# Patient Record
Sex: Male | Born: 1975
Health system: Southern US, Community
[De-identification: ages and names within clinical notes are randomized; demographics above are authoritative.]

## PROBLEM LIST (undated history)

## (undated) DIAGNOSIS — Z8659 Personal history of other mental and behavioral disorders: Secondary | ICD-10-CM

## (undated) DIAGNOSIS — F329 Major depressive disorder, single episode, unspecified: Secondary | ICD-10-CM

## (undated) DIAGNOSIS — M1990 Unspecified osteoarthritis, unspecified site: Secondary | ICD-10-CM

## (undated) DIAGNOSIS — G8929 Other chronic pain: Secondary | ICD-10-CM

## (undated) DIAGNOSIS — Z8669 Personal history of other diseases of the nervous system and sense organs: Secondary | ICD-10-CM

## (undated) DIAGNOSIS — Z87828 Personal history of other (healed) physical injury and trauma: Secondary | ICD-10-CM

## (undated) DIAGNOSIS — K219 Gastro-esophageal reflux disease without esophagitis: Secondary | ICD-10-CM

## (undated) DIAGNOSIS — F32A Depression, unspecified: Secondary | ICD-10-CM

## (undated) DIAGNOSIS — B1920 Unspecified viral hepatitis C without hepatic coma: Secondary | ICD-10-CM

## (undated) DIAGNOSIS — R569 Unspecified convulsions: Secondary | ICD-10-CM

## (undated) DIAGNOSIS — I10 Essential (primary) hypertension: Secondary | ICD-10-CM

## (undated) DIAGNOSIS — F209 Schizophrenia, unspecified: Secondary | ICD-10-CM

## (undated) DIAGNOSIS — M549 Dorsalgia, unspecified: Secondary | ICD-10-CM

## (undated) HISTORY — DX: Unspecified viral hepatitis C without hepatic coma: B19.20

## (undated) HISTORY — PX: WISDOM TOOTH EXTRACTION: SHX21

## (undated) HISTORY — DX: Schizophrenia, unspecified: F20.9

## (undated) HISTORY — DX: Personal history of other mental and behavioral disorders: Z86.59

## (undated) HISTORY — PX: DECOMPRESSION FASCIOTOMY LEG: SUR403

## (undated) HISTORY — DX: Personal history of other (healed) physical injury and trauma: Z87.828

## (undated) HISTORY — DX: Personal history of other diseases of the nervous system and sense organs: Z86.69

## (undated) HISTORY — DX: Major depressive disorder, single episode, unspecified: F32.9

## (undated) HISTORY — DX: Depression, unspecified: F32.A

---

## 2001-01-22 ENCOUNTER — Emergency Department (HOSPITAL_COMMUNITY): Admission: EM | Admit: 2001-01-22 | Discharge: 2001-01-22 | Payer: Self-pay | Admitting: Internal Medicine

## 2001-01-22 ENCOUNTER — Encounter: Payer: Self-pay | Admitting: Internal Medicine

## 2002-02-22 ENCOUNTER — Encounter: Payer: Self-pay | Admitting: Emergency Medicine

## 2002-02-22 ENCOUNTER — Emergency Department (HOSPITAL_COMMUNITY): Admission: EM | Admit: 2002-02-22 | Discharge: 2002-02-22 | Payer: Self-pay | Admitting: Emergency Medicine

## 2005-07-08 ENCOUNTER — Emergency Department (HOSPITAL_COMMUNITY): Admission: EM | Admit: 2005-07-08 | Discharge: 2005-07-08 | Payer: Self-pay | Admitting: Emergency Medicine

## 2006-06-15 ENCOUNTER — Emergency Department (HOSPITAL_COMMUNITY): Admission: EM | Admit: 2006-06-15 | Discharge: 2006-06-15 | Payer: Self-pay | Admitting: Emergency Medicine

## 2007-01-18 ENCOUNTER — Emergency Department (HOSPITAL_COMMUNITY): Admission: EM | Admit: 2007-01-18 | Discharge: 2007-01-18 | Payer: Self-pay | Admitting: Emergency Medicine

## 2007-05-28 ENCOUNTER — Emergency Department (HOSPITAL_COMMUNITY): Admission: EM | Admit: 2007-05-28 | Discharge: 2007-05-28 | Payer: Self-pay | Admitting: Emergency Medicine

## 2007-06-01 ENCOUNTER — Emergency Department (HOSPITAL_COMMUNITY): Admission: EM | Admit: 2007-06-01 | Discharge: 2007-06-01 | Payer: Self-pay | Admitting: Emergency Medicine

## 2007-07-04 ENCOUNTER — Emergency Department (HOSPITAL_COMMUNITY): Admission: EM | Admit: 2007-07-04 | Discharge: 2007-07-04 | Payer: Self-pay | Admitting: Emergency Medicine

## 2007-08-02 ENCOUNTER — Emergency Department (HOSPITAL_COMMUNITY): Admission: EM | Admit: 2007-08-02 | Discharge: 2007-08-03 | Payer: Self-pay | Admitting: Emergency Medicine

## 2007-10-12 ENCOUNTER — Emergency Department (HOSPITAL_COMMUNITY): Admission: EM | Admit: 2007-10-12 | Discharge: 2007-10-13 | Payer: Self-pay | Admitting: Emergency Medicine

## 2007-10-14 ENCOUNTER — Emergency Department (HOSPITAL_COMMUNITY): Admission: EM | Admit: 2007-10-14 | Discharge: 2007-10-14 | Payer: Self-pay | Admitting: Emergency Medicine

## 2008-08-23 ENCOUNTER — Emergency Department (HOSPITAL_COMMUNITY): Admission: EM | Admit: 2008-08-23 | Discharge: 2008-08-23 | Payer: Self-pay | Admitting: Emergency Medicine

## 2008-10-19 ENCOUNTER — Ambulatory Visit (HOSPITAL_COMMUNITY): Admission: RE | Admit: 2008-10-19 | Discharge: 2008-10-19 | Payer: Self-pay | Admitting: Family Medicine

## 2009-02-13 ENCOUNTER — Ambulatory Visit (HOSPITAL_COMMUNITY): Admission: AD | Admit: 2009-02-13 | Discharge: 2009-02-13 | Payer: Self-pay | Admitting: Orthopedic Surgery

## 2009-04-11 ENCOUNTER — Encounter (INDEPENDENT_AMBULATORY_CARE_PROVIDER_SITE_OTHER): Payer: Self-pay | Admitting: Oral Surgery

## 2009-04-11 ENCOUNTER — Ambulatory Visit (HOSPITAL_COMMUNITY): Admission: RE | Admit: 2009-04-11 | Discharge: 2009-04-11 | Payer: Self-pay | Admitting: Oral Surgery

## 2009-07-04 ENCOUNTER — Emergency Department (HOSPITAL_COMMUNITY): Admission: EM | Admit: 2009-07-04 | Discharge: 2009-07-04 | Payer: Self-pay | Admitting: Emergency Medicine

## 2009-10-06 ENCOUNTER — Ambulatory Visit: Payer: Self-pay | Admitting: Family Medicine

## 2010-11-02 LAB — CBC
MCV: 81.1 fL (ref 78.0–100.0)
RBC: 4.77 MIL/uL (ref 4.22–5.81)
WBC: 6.9 10*3/uL (ref 4.0–10.5)

## 2010-11-04 LAB — CBC
HCT: 37.4 % — ABNORMAL LOW (ref 39.0–52.0)
Hemoglobin: 12.5 g/dL — ABNORMAL LOW (ref 13.0–17.0)
MCV: 80.1 fL (ref 78.0–100.0)
RBC: 4.67 MIL/uL (ref 4.22–5.81)
WBC: 8.7 10*3/uL (ref 4.0–10.5)

## 2010-11-04 LAB — COMPREHENSIVE METABOLIC PANEL
BUN: 14 mg/dL (ref 6–23)
CO2: 24 mEq/L (ref 19–32)
Chloride: 104 mEq/L (ref 96–112)
Creatinine, Ser: 0.94 mg/dL (ref 0.4–1.5)
GFR calc non Af Amer: >60 mL/min (ref >60)
Glucose, Bld: 108 mg/dL — ABNORMAL HIGH (ref 70–99)
Total Bilirubin: 0.4 mg/dL (ref 0.3–1.2)

## 2010-11-04 LAB — APTT: aPTT: 36 seconds (ref 24–37)

## 2010-11-04 LAB — DIFFERENTIAL
Basophils Absolute: 0 10*3/uL (ref 0.0–0.1)
Basophils Relative: 0 % (ref 0–1)
Eosinophils Relative: 1 % (ref 0–5)
Lymphocytes Relative: 18 % (ref 12–46)
Neutro Abs: 6.7 10*3/uL (ref 1.7–7.7)

## 2010-11-04 LAB — PROTIME-INR: Prothrombin Time: 13.8 seconds (ref 11.6–15.2)

## 2010-11-12 LAB — URINALYSIS, ROUTINE W REFLEX MICROSCOPIC
Bilirubin Urine: NEGATIVE
Glucose, UA: NEGATIVE mg/dL
Ketones, ur: NEGATIVE mg/dL
Protein, ur: NEGATIVE mg/dL
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
## 2011-02-20 ENCOUNTER — Encounter: Payer: Self-pay | Admitting: Emergency Medicine

## 2011-02-20 ENCOUNTER — Emergency Department (HOSPITAL_COMMUNITY)
Admission: EM | Admit: 2011-02-20 | Discharge: 2011-02-21 | Disposition: A | Discharge status: Home / Self Care | Payer: Medicare Other | Attending: Emergency Medicine | Admitting: Emergency Medicine

## 2011-02-20 ENCOUNTER — Emergency Department (HOSPITAL_COMMUNITY): Payer: Medicare Other

## 2011-02-20 DIAGNOSIS — Z87442 Personal history of urinary calculi: Secondary | ICD-10-CM | POA: Insufficient documentation

## 2011-02-20 DIAGNOSIS — R079 Chest pain, unspecified: Secondary | ICD-10-CM | POA: Insufficient documentation

## 2011-02-20 DIAGNOSIS — T148XXA Other injury of unspecified body region, initial encounter: Secondary | ICD-10-CM

## 2011-02-20 DIAGNOSIS — I10 Essential (primary) hypertension: Secondary | ICD-10-CM | POA: Insufficient documentation

## 2011-02-20 DIAGNOSIS — IMO0001 Reserved for inherently not codable concepts without codable children: Secondary | ICD-10-CM | POA: Insufficient documentation

## 2011-02-20 DIAGNOSIS — IMO0002 Reserved for concepts with insufficient information to code with codable children: Secondary | ICD-10-CM | POA: Insufficient documentation

## 2011-02-20 DIAGNOSIS — R4182 Altered mental status, unspecified: Secondary | ICD-10-CM | POA: Insufficient documentation

## 2011-02-20 DIAGNOSIS — F17200 Nicotine dependence, unspecified, uncomplicated: Secondary | ICD-10-CM | POA: Insufficient documentation

## 2011-02-20 DIAGNOSIS — M791 Myalgia, unspecified site: Secondary | ICD-10-CM

## 2011-02-20 DIAGNOSIS — R52 Pain, unspecified: Secondary | ICD-10-CM | POA: Insufficient documentation

## 2011-02-20 HISTORY — DX: Essential (primary) hypertension: I10

## 2011-02-20 LAB — CBC
Hemoglobin: 12.5 g/dL — ABNORMAL LOW (ref 13.0–17.0)
MCH: 27.1 pg (ref 26.0–34.0)
Platelets: 245 10*3/uL (ref 150–400)
RBC: 4.61 MIL/uL (ref 4.22–5.81)
WBC: 9.7 10*3/uL (ref 4.0–10.5)

## 2011-02-20 LAB — BASIC METABOLIC PANEL
Calcium: 9 mg/dL (ref 8.4–10.5)
GFR calc Af Amer: >60 mL/min (ref >60)
GFR calc non Af Amer: >60 mL/min (ref >60)
Glucose, Bld: 90 mg/dL (ref 70–99)
Potassium: 3.8 mEq/L (ref 3.5–5.1)
Sodium: 138 mEq/L (ref 135–145)

## 2011-02-20 LAB — SAMPLE TO BLOOD BANK

## 2011-02-20 MED ORDER — MORPHINE SULFATE 4 MG/ML IJ SOLN
4.0000 mg | Freq: Once | INTRAMUSCULAR | Status: AC
  Administered 2011-02-20: 4 mg via INTRAVENOUS
  Filled 2011-02-20: qty 1

## 2011-02-20 MED ORDER — ONDANSETRON HCL 4 MG/2ML IJ SOLN
4.0000 mg | Freq: Once | INTRAMUSCULAR | Status: AC
  Administered 2011-02-20: 4 mg via INTRAVENOUS
  Filled 2011-02-20: qty 2
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
## 2011-02-20 NOTE — ED Notes (Signed)
J. Idol, PA at bedside. 

## 2011-02-20 NOTE — ED Notes (Signed)
Patient was informed that an UA is needed and patient states that his back hurts way too much to be sitting up. When attempting to raise the head of the stretcher patient was stating he was in too much pain to lay the head back down. Family member in room wants to know why urine is needed stated to patient that urine is part of protocol.

## 2011-02-20 NOTE — ED Notes (Signed)
Patient is complaining of pain. Family at bedside. Hooked patient up to the monitor and took vitals. RN Janette aware of pain she is in now giving patient an IV.

## 2011-02-20 NOTE — ED Notes (Signed)
MD Fredricka Bonine cleared patient to come off the back born.

## 2011-02-21 ENCOUNTER — Encounter (HOSPITAL_COMMUNITY): Payer: Self-pay | Admitting: *Deleted

## 2011-02-21 ENCOUNTER — Emergency Department (HOSPITAL_COMMUNITY): Payer: Medicare Other

## 2011-02-21 LAB — RAPID URINE DRUG SCREEN, HOSP PERFORMED
Amphetamines: NOT DETECTED
Barbiturates: NOT DETECTED

## 2011-02-21 MED ORDER — CYCLOBENZAPRINE HCL 5 MG PO TABS: 5.0000 mg | ORAL_TABLET | Freq: Three times a day (TID) | ORAL | Status: AC | PRN

## 2011-02-21 MED ORDER — IOHEXOL 300 MG/ML  SOLN
120.0000 mL | Freq: Once | INTRAMUSCULAR | Status: AC | PRN
  Administered 2011-02-21: 120 mL via INTRAVENOUS

## 2011-02-21 MED ORDER — OXYCODONE-ACETAMINOPHEN 5-325 MG PO TABS
2.0000 | ORAL_TABLET | Freq: Once | ORAL | Status: AC
  Administered 2011-02-21: 2 via ORAL
  Filled 2011-02-21: qty 2
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
## 2011-05-08 LAB — CULTURE, ROUTINE-ABSCESS

## 2011-11-27 ENCOUNTER — Ambulatory Visit: Payer: Self-pay | Admitting: Unknown Physician Specialty

## 2012-01-15 ENCOUNTER — Emergency Department (HOSPITAL_COMMUNITY)
Admission: EM | Admit: 2012-01-15 | Discharge: 2012-01-15 | Disposition: A | Discharge status: Home / Self Care | Payer: Medicare Other | Attending: Emergency Medicine | Admitting: Emergency Medicine

## 2012-01-15 ENCOUNTER — Encounter (HOSPITAL_COMMUNITY): Payer: Self-pay | Admitting: Emergency Medicine

## 2012-01-15 DIAGNOSIS — M129 Arthropathy, unspecified: Secondary | ICD-10-CM | POA: Insufficient documentation

## 2012-01-15 DIAGNOSIS — Z885 Allergy status to narcotic agent status: Secondary | ICD-10-CM | POA: Insufficient documentation

## 2012-01-15 DIAGNOSIS — Z886 Allergy status to analgesic agent status: Secondary | ICD-10-CM | POA: Insufficient documentation

## 2012-01-15 DIAGNOSIS — L509 Urticaria, unspecified: Secondary | ICD-10-CM | POA: Insufficient documentation

## 2012-01-15 DIAGNOSIS — F17200 Nicotine dependence, unspecified, uncomplicated: Secondary | ICD-10-CM | POA: Insufficient documentation

## 2012-01-15 DIAGNOSIS — Z87442 Personal history of urinary calculi: Secondary | ICD-10-CM | POA: Insufficient documentation

## 2012-01-15 DIAGNOSIS — Z880 Allergy status to penicillin: Secondary | ICD-10-CM | POA: Insufficient documentation

## 2012-01-15 DIAGNOSIS — I10 Essential (primary) hypertension: Secondary | ICD-10-CM | POA: Insufficient documentation

## 2012-01-15 DIAGNOSIS — Z79899 Other long term (current) drug therapy: Secondary | ICD-10-CM | POA: Insufficient documentation

## 2012-01-15 HISTORY — DX: Unspecified osteoarthritis, unspecified site: M19.90

## 2012-01-15 HISTORY — DX: Dorsalgia, unspecified: M54.9

## 2012-01-15 HISTORY — DX: Other chronic pain: G89.29

## 2012-01-15 MED ORDER — PREDNISONE 50 MG PO TABS: 50.0000 mg | ORAL_TABLET | Freq: Every day | ORAL | Status: DC

## 2012-01-15 MED ORDER — FAMOTIDINE 20 MG PO TABS: 20.0000 mg | ORAL_TABLET | Freq: Two times a day (BID) | ORAL | Status: DC

## 2012-01-15 MED ORDER — METHYLPREDNISOLONE SODIUM SUCC 125 MG IJ SOLR
125.0000 mg | Freq: Once | INTRAMUSCULAR | Status: AC
  Administered 2012-01-15: 125 mg via INTRAVENOUS
  Filled 2012-01-15: qty 2

## 2012-01-15 MED ORDER — DIPHENHYDRAMINE HCL 50 MG/ML IJ SOLN
25.0000 mg | Freq: Once | INTRAMUSCULAR | Status: AC
  Administered 2012-01-15: 25 mg via INTRAVENOUS
  Filled 2012-01-15: qty 1

## 2012-01-15 MED ORDER — DIPHENHYDRAMINE HCL 25 MG PO TABS: 25.0000 mg | ORAL_TABLET | ORAL | Status: DC | PRN

## 2012-01-15 MED ORDER — LORATADINE 10 MG PO TABS: 10.0000 mg | ORAL_TABLET | Freq: Every day | ORAL | Status: DC

## 2012-01-15 MED ORDER — FAMOTIDINE IN NACL 20-0.9 MG/50ML-% IV SOLN
20.0000 mg | Freq: Once | INTRAVENOUS | Status: AC
  Administered 2012-01-15: 20 mg via INTRAVENOUS
  Filled 2012-01-15: qty 50

## 2012-01-15 NOTE — ED Provider Notes (Signed)
History     CSN: [id number]  Arrival date & time 01/15/12  4098   First MD Initiated Contact with Patient 01/15/12 0415      Chief Complaint  Patient presents with  . Allergic Reaction    (Consider location/radiation/quality/duration/timing/severity/associated sxs/prior treatment) Patient is a 36 y.o. male presenting with allergic reaction. The history is provided by the patient.  Allergic Reaction  He had onset at about 3 AM of rash on his chest, back, abdomen, arms. Rashes) tic. He denies any difficulty breathing or swallowing. He used a skin protectant cream which did not give him any relief. Any other medication at home. He denies any new medications and denies any new soaps, laundry detergents, fabric softeners, colognes, foods. He has never had reactions like this before. Symptoms are severe. Nothing makes it better nothing makes it worse. Symptoms have been stable since onset.  Past Medical History  Diagnosis Date  . Hypertension   . Kidney calculi   . Kidney calculi   . Chronic back pain   . Arthritis     History reviewed. No pertinent past surgical history.  History reviewed. No pertinent family history.  History  Substance Use Topics  . Smoking status: Current Everyday Smoker -- 1.0 packs/day    Types: Cigarettes  . Smokeless tobacco: Not on file  . Alcohol Use: Yes     occasionally      Review of Systems  All other systems reviewed and are negative.    Allergies  Amoxicillin; Hydrocodone; and Ibuprofen  Home Medications   Current Outpatient Rx  Name Route Sig Dispense Refill  . CLONIDINE HCL PO Oral Take 1 tablet by mouth daily.      . DULOXETINE HCL 30 MG PO CPEP Oral Take 90 mg by mouth daily.      Marland Kitchen NEURONTIN PO Oral Take by mouth.      Marland Kitchen LISINOPRIL PO Oral Take by mouth.      . OXYCODONE HCL ER 10 MG PO TB12 Oral Take by mouth.        BP 141/79  Pulse 77  Temp 97.7 F (36.5 C) (Oral)  Resp 16  Ht 6' (1.829 m)  Wt 241 lb (109.317 kg)   BMI 32.69 kg/m2  SpO2 96%  Physical Exam  Nursing note and vitals reviewed.  36 year old male who is resting comfortably and in no acute distress. Vital signs are significant for borderline hypertension with blood pressure 141/79. Oxygen saturation is 96% on room air which is normal head is normocephalic and atraumatic. PERRLA, EOMI. Oropharynx is clear. There is no edema the uvula oropharynx or tongue or lips. He has had difficulty with secretions and phonation is normal. Neck is nontender and supple without adenopathy or stridor. Lungs are clear without rales, wheezes, rhonchi. Heart has regular rate and rhythm without murmur. Abdomen is soft, flat, nontender without masses or hepatosplenomegaly. Extremities have no cyanosis or edema, full range of motion is present. Skin: There is an urticarial rash with large areas of confluence over the chest, back, abdomen and arms. Neurologic: Mental status is normal, cranial nerves are intact, there are no motor or sensory deficits.  ED Course  Procedures (including critical care time)    1. Urticaria       MDM  Urticaria-etiology unclear. You will be treated with steroids, H2 blockers, and antihistamines.  After IV Solu-Medrol, IV diphenhydramine, and IV famotidine, rash went away completely and itching went away completely. He is sent home with a prescription  for prednisone, loratadine, famotidine, and diphenhydramine.      Dione Booze, MD 01/15/12 [phone]

## 2012-01-15 NOTE — ED Notes (Signed)
Patient complaining of hives on torso, back, and bilateral arms started about 1 hour ago.

## 2012-01-15 NOTE — ED Notes (Signed)
edp aware of severity of allergic reaction.

## 2012-01-15 NOTE — Discharge Instructions (Signed)
Return to the emergency department if you have further problems-especially any problems breathing or swallowing.  Hives Hives (urticaria) are itchy, red, swollen patches on the skin. They may change size, shape, and location quickly and repeatedly. Hives that occur deeper in the skin can cause swelling of the hands, feet, and face. Hives may be an allergic reaction to something you or your child ate, touched, or put on the skin. Hives can also be a reaction to cold, heat, viral infections, medication, insect bites, or emotional stress. Often the cause is hard to find. Hives can come and go for several days to several weeks. Hives are not contagious. HOME CARE INSTRUCTIONS   If the cause of the hives is known, avoid exposure to that source.   To relieve itching and rash:   Apply cold compresses to the skin or take cool water baths. Do not take or give your child hot baths or showers because the warmth will make the itching worse.   The best medicine for hives is an antihistamine. An antihistamine will not cure hives, but it will reduce their severity. You can use an antihistamine available over the counter. This medicine may make your child sleepy. Teenagers should not drive while using this medicine.   Take or give an antihistamine every 6 hours until the hives are completely gone for 24 hours or as directed.   Your child may have other medications prescribed for itching. Give these as directed by your child's caregiver.   You or your child should wear loose fitting clothing, including undergarments. Skin irritations may make hives worse.   Follow-up as directed by your caregiver.  SEEK MEDICAL CARE IF:   You or your child still have considerable itching after taking the medication (prescribed or purchased over the counter).   Joint swelling or pain occurs.  SEEK IMMEDIATE MEDICAL CARE IF:   You have a fever.   Swollen lips or tongue are noticed.   There is difficulty with breathing,  swallowing, or tightness in the throat or chest.   Abdominal pain develops.   Your child starts acting very sick.  These may be the first signs of a life-threatening allergic reaction. THIS IS AN EMERGENCY. Call 911 for medical help. MAKE SURE YOU:   Understand these instructions.   Will watch your condition.   Will get help right away if you are not doing well or get worse.  Document Released: 07/15/2005 Document Revised: 07/04/2011 Document Reviewed: 03/04/2008 [facility] Patient Information 2012 [place], [place].  Prednisone tablets What is this medicine? PREDNISONE (PRED ni sone) is a corticosteroid. It is commonly used to treat inflammation of the skin, joints, lungs, and other organs. Common conditions treated include asthma, allergies, and arthritis. It is also used for other conditions, such as blood disorders and diseases of the adrenal glands. This medicine may be used for other purposes; ask your health care provider or pharmacist if you have questions. What should I tell my health care provider before I take this medicine? They need to know if you have any of these conditions: -Cushing's syndrome -diabetes -glaucoma -heart disease -high blood pressure -infection (especially a virus infection such as chickenpox, cold sores, or herpes) -kidney disease -liver disease -mental illness -myasthenia gravis -osteoporosis -seizures -stomach or intestine problems -thyroid disease -an unusual or allergic reaction to lactose, prednisone, other medicines, foods, dyes, or preservatives -pregnant or trying to get pregnant -breast-feeding How should I use this medicine? Take this medicine by mouth with a glass of  water. Follow the directions on the prescription label. Take this medicine with food. If you are taking this medicine once a day, take it in the morning. Do not take more medicine than you are told to take. Do not suddenly stop taking your medicine because you may develop a  severe reaction. Your doctor will tell you how much medicine to take. If your doctor wants you to stop the medicine, the dose may be slowly lowered over time to avoid any side effects. Talk to your pediatrician regarding the use of this medicine in children. Special care may be needed. Overdosage: If you think you have taken too much of this medicine contact a poison control center or emergency room at once. NOTE: This medicine is only for you. Do not share this medicine with others. What if I miss a dose? If you miss a dose, take it as soon as you can. If it is almost time for your next dose, talk to your doctor or health care professional. You may need to miss a dose or take an extra dose. Do not take double or extra doses without advice. What may interact with this medicine? Do not take this medicine with any of the following medications: -metyrapone -mifepristone This medicine may also interact with the following medications: -aminoglutethimide -amphotericin B -aspirin and aspirin-like medicines -barbiturates -certain medicines for diabetes, like glipizide or glyburide -cholestyramine -cholinesterase inhibitors -cyclosporine -digoxin -diuretics -ephedrine -male hormones, like estrogens and birth control pills -isoniazid -ketoconazole -NSAIDS, medicines for pain and inflammation, like ibuprofen or naproxen -phenytoin -rifampin -toxoids -vaccines -warfarin This list may not describe all possible interactions. Give your health care provider a list of all the medicines, herbs, non-prescription drugs, or dietary supplements you use. Also tell them if you smoke, drink alcohol, or use illegal drugs. Some items may interact with your medicine. What should I watch for while using this medicine? Visit your doctor or health care professional for regular checks on your progress. If you are taking this medicine over a prolonged period, carry an identification card with your name and  address, the type and dose of your medicine, and your doctor's name and address. This medicine may increase your risk of getting an infection. Tell your doctor or health care professional if you are around anyone with measles or chickenpox, or if you develop sores or blisters that do not heal properly. If you are going to have surgery, tell your doctor or health care professional that you have taken this medicine within the last twelve months. Ask your doctor or health care professional about your diet. You may need to lower the amount of salt you eat. This medicine may affect blood sugar levels. If you have diabetes, check with your doctor or health care professional before you change your diet or the dose of your diabetic medicine. What side effects may I notice from receiving this medicine? Side effects that you should report to your doctor or health care professional as soon as possible: -allergic reactions like skin rash, itching or hives, swelling of the face, lips, or tongue -changes in emotions or moods -changes in vision -depressed mood -eye pain -fever or chills, cough, sore throat, pain or difficulty passing urine -increased thirst -swelling of ankles, feet Side effects that usually do not require medical attention (report to your doctor or health care professional if they continue or are bothersome): -confusion, excitement, restlessness -headache -nausea, vomiting -skin problems, acne, thin and shiny skin -trouble sleeping -weight gain This list may  not describe all possible side effects. Call your doctor for medical advice about side effects. You may report side effects to FDA at 1-800-FDA-1088. Where should I keep my medicine? Keep out of the reach of children. Store at room temperature between 15 and 30 degrees C (59 and 86 degrees F). Protect from light. Keep container tightly closed. Throw away any unused medicine after the expiration date. NOTE: This sheet is a summary. It  may not cover all possible information. If you have questions about this medicine, talk to your doctor, pharmacist, or health care provider.  2012, [employer]. (02/28/2011 10:57:14 AM)  Famotidine tablets or gelcaps What is this medicine? FAMOTIDINE (fa MOE ti deen) is a type of antihistamine that blocks the release of stomach acid. It is used to treat stomach or intestinal ulcers. It can also relieve heartburn from acid reflux. This medicine may be used for other purposes; ask your health care provider or pharmacist if you have questions. What should I tell my health care provider before I take this medicine? They need to know if you have any of these conditions: -kidney or liver disease -trouble swallowing -an unusual or allergic reaction to famotidine, other medicines, foods, dyes, or preservatives -pregnant or trying to get pregnant -breast-feeding How should I use this medicine? Take this medicine by mouth with a glass of water. Follow the directions on the prescription label. If you only take this medicine once a day, take it at bedtime. Take your doses at regular intervals. Do not take your medicine more often than directed. Talk to your pediatrician regarding the use of this medicine in children. Special care may be needed. Overdosage: If you think you have taken too much of this medicine contact a poison control center or emergency room at once. NOTE: This medicine is only for you. Do not share this medicine with others. What if I miss a dose? If you miss a dose, take it as soon as you can. If it is almost time for your next dose, take only that dose. Do not take double or extra doses. What may interact with this medicine? -delavirdine -itraconazole -ketoconazole This list may not describe all possible interactions. Give your health care provider a list of all the medicines, herbs, non-prescription drugs, or dietary supplements you use. Also tell them if you smoke, drink  alcohol, or use illegal drugs. Some items may interact with your medicine. What should I watch for while using this medicine? Tell your doctor or health care professional if your condition does not start to get better or if it gets worse. Finish the full course of tablets prescribed, even if you feel better. Do not take with aspirin, ibuprofen or other antiinflammatory medicines. These can make your condition worse. Do not smoke cigarettes or drink alcohol. These cause irritation in your stomach and can increase the time it will take for ulcers to heal. If you need to take an antacid, you should take it at least 1 hour before or 1 hour after this medicine. This medicine will not be as effective if taken at the same time as an antacid. If you get black, tarry stools or vomit up what looks like coffee grounds, call your doctor or health care professional at once. You may have a bleeding ulcer. What side effects may I notice from receiving this medicine? Side effects that you should report to your doctor or health care professional as soon as possible: -agitation, nervousness -confusion -hallucinations -skin rash, itching Side  effects that usually do not require medical attention (report to your doctor or health care professional if they continue or are bothersome): -constipation -diarrhea -dizziness -headache This list may not describe all possible side effects. Call your doctor for medical advice about side effects. You may report side effects to FDA at 1-800-FDA-1088. Where should I keep my medicine? Keep out of the reach of children. Store at room temperature between 15 and 30 degrees C (59 and 86 degrees F). Do not freeze. Throw away any unused medicine after the expiration date. NOTE: This sheet is a summary. It may not cover all possible information. If you have questions about this medicine, talk to your doctor, pharmacist, or health care provider.  2012, [employer].  (11/18/2007 1:21:06 PM)  Loratadine tablets What is this medicine? LORATADINE (lor AT a deen) is an antihistamine. It helps to relieve sneezing, runny nose, and itchy, watery eyes. This medicine is used to treat the symptoms of allergies. It is also used to treat itchy skin rash and hives. This medicine may be used for other purposes; ask your health care provider or pharmacist if you have questions. What should I tell my health care provider before I take this medicine? They need to know if you have any of these conditions: -asthma -kidney disease -liver disease -an unusual or allergic reaction to loratadine, other antihistamines, other medicines, foods, dyes, or preservatives -pregnant or trying to get pregnant -breast-feeding How should I use this medicine? Take this medicine by mouth with a glass of water. Follow the directions on the label. You may take this medicine with food or on an empty stomach. Take your medicine at regular intervals. Do not take your medicine more often than directed. Talk to your pediatrician regarding the use of this medicine in children. While this medicine may be used in children as young as 6 years for selected conditions, precautions do apply. Overdosage: If you think you have taken too much of this medicine contact a poison control center or emergency room at once. NOTE: This medicine is only for you. Do not share this medicine with others. What if I miss a dose? If you miss a dose, take it as soon as you can. If it is almost time for your next dose, take only that dose. Do not take double or extra doses. What may interact with this medicine? -other medicines for colds or allergies This list may not describe all possible interactions. Give your health care provider a list of all the medicines, herbs, non-prescription drugs, or dietary supplements you use. Also tell them if you smoke, drink alcohol, or use illegal drugs. Some items may interact with your  medicine. What should I watch for while using this medicine? Tell your doctor or healthcare professional if your symptoms do not start to get better or if they get worse. Your mouth may get dry. Chewing sugarless gum or sucking hard candy, and drinking plenty of water may help. Contact your doctor if the problem does not go away or is severe. You may get drowsy or dizzy. Do not drive, use machinery, or do anything that needs mental alertness until you know how this medicine affects you. Do not stand or sit up quickly, especially if you are an older patient. This reduces the risk of dizzy or fainting spells. What side effects may I notice from receiving this medicine? Side effects that you should report to your doctor or health care professional as soon as possible: -allergic reactions like  skin rash, itching or hives, swelling of the face, lips, or tongue -breathing problems -unusually restless or nervous Side effects that usually do not require medical attention (report to your doctor or health care professional if they continue or are bothersome): -drowsiness -dry or irritated mouth or throat -headache This list may not describe all possible side effects. Call your doctor for medical advice about side effects. You may report side effects to FDA at 1-800-FDA-1088. Where should I keep my medicine? Keep out of the reach of children. Store at room temperature between 2 and 30 degrees C (36 and 86 degrees F). Protect from moisture. Throw away any unused medicine after the expiration date. NOTE: This sheet is a summary. It may not cover all possible information. If you have questions about this medicine, talk to your doctor, pharmacist, or health care provider.  2012, [employer]. (01/18/2008 5:17:24 PM)  Diphenhydramine capsules or tablets What is this medicine? DIPHENHYDRAMINE (dye fen HYE dra meen) is an antihistamine. It is used to treat the symptoms of an allergic reaction. It is  also used to treat Parkinson's disease. This medicine is also used to prevent and to treat motion sickness and as a nighttime sleep aid. This medicine may be used for other purposes; ask your health care provider or pharmacist if you have questions. What should I tell my health care provider before I take this medicine? They need to know if you have any of these conditions: -asthma or lung disease -glaucoma -high blood pressure or heart disease -liver disease -pain or difficulty passing urine -prostate trouble -ulcers or other stomach problems -an unusual or allergic reaction to diphenhydramine, other medicines foods, dyes, or preservatives such as sulfites -pregnant or trying to get pregnant -breast-feeding How should I use this medicine? Take this medicine by mouth with a full glass of water. Follow the directions on the prescription label. Take your doses at regular intervals. Do not take your medicine more often than directed. To prevent motion sickness start taking this medicine 30 to 60 minutes before you leave. Talk to your pediatrician regarding the use of this medicine in children. Special care may be needed. Patients over 6 years old may have a stronger reaction and need a smaller dose. Overdosage: If you think you have taken too much of this medicine contact a poison control center or emergency room at once. NOTE: This medicine is only for you. Do not share this medicine with others. What if I miss a dose? If you miss a dose, take it as soon as you can. If it is almost time for your next dose, take only that dose. Do not take double or extra doses. What may interact with this medicine? Do not take this medicine with any of the following medications: -MAOIs like Carbex, Eldepryl, Marplan, Nardil, and Parnate This medicine may also interact with the following medications: -alcohol -barbiturates, like phenobarbital -medicines for bladder spasm like oxybutynin,  tolterodine -medicines for blood pressure -medicines for depression, anxiety, or psychotic disturbances -medicines for movement abnormalities or Parkinson's disease -medicines for sleep -other medicines for cold, cough or allergy -some medicines for the stomach like chlordiazepoxide, dicyclomine This list may not describe all possible interactions. Give your health care provider a list of all the medicines, herbs, non-prescription drugs, or dietary supplements you use. Also tell them if you smoke, drink alcohol, or use illegal drugs. Some items may interact with your medicine. What should I watch for while using this medicine? Visit your doctor or  health care professional for regular check ups. Tell your doctor if your symptoms do not improve or if they get worse. Your mouth may get dry. Chewing sugarless gum or sucking hard candy, and drinking plenty of water may help. Contact your doctor if the problem does not go away or is severe. This medicine may cause dry eyes and blurred vision. If you wear contact lenses you may feel some discomfort. Lubricating drops may help. See your eye doctor if the problem does not go away or is severe. You may get drowsy or dizzy. Do not drive, use machinery, or do anything that needs mental alertness until you know how this medicine affects you. Do not stand or sit up quickly, especially if you are an older patient. This reduces the risk of dizzy or fainting spells. Alcohol may interfere with the effect of this medicine. Avoid alcoholic drinks. What side effects may I notice from receiving this medicine? Side effects that you should report to your doctor or health care professional as soon as possible: -allergic reactions like skin rash, itching or hives, swelling of the face, lips, or tongue -changes in vision -confused, agitated, nervous -irregular or fast heartbeat -tremor -trouble passing urine -unusual bleeding or bruising -unusually weak or tired Side  effects that usually do not require medical attention (report to your doctor or health care professional if they continue or are bothersome): -constipation, diarrhea -drowsy -headache -loss of appetite -stomach upset, vomiting -thick mucous This list may not describe all possible side effects. Call your doctor for medical advice about side effects. You may report side effects to FDA at 1-800-FDA-1088. Where should I keep my medicine? Keep out of the reach of children. Store at room temperature between 15 and 30 degrees C (59 and 86 degrees F). Keep container closed tightly. Throw away any unused medicine after the expiration date. NOTE: This sheet is a summary. It may not cover all possible information. If you have questions about this medicine, talk to your doctor, pharmacist, or health care provider.  2012, [employer]. (11/02/2007 5:06:22 PM)

## 2012-02-17 ENCOUNTER — Other Ambulatory Visit: Payer: Self-pay | Admitting: Neurology

## 2012-02-17 DIAGNOSIS — R413 Other amnesia: Secondary | ICD-10-CM

## 2012-02-17 DIAGNOSIS — G40209 Localization-related (focal) (partial) symptomatic epilepsy and epileptic syndromes with complex partial seizures, not intractable, without status epilepticus: Secondary | ICD-10-CM

## 2012-02-21 ENCOUNTER — Ambulatory Visit
Admission: RE | Admit: 2012-02-21 | Discharge: 2012-02-21 | Disposition: A | Discharge status: Home / Self Care | Payer: Medicare Other | Source: Ambulatory Visit | Attending: Neurology | Admitting: Neurology

## 2012-02-21 ENCOUNTER — Encounter: Payer: Self-pay | Admitting: Cardiovascular Disease

## 2012-02-21 DIAGNOSIS — R413 Other amnesia: Secondary | ICD-10-CM

## 2012-02-21 DIAGNOSIS — G40209 Localization-related (focal) (partial) symptomatic epilepsy and epileptic syndromes with complex partial seizures, not intractable, without status epilepticus: Secondary | ICD-10-CM

## 2012-02-21 MED ORDER — GADOBENATE DIMEGLUMINE 529 MG/ML IV SOLN
20.0000 mL | Freq: Once | INTRAVENOUS | Status: AC | PRN
  Administered 2012-02-21: 20 mL via INTRAVENOUS

## 2012-02-26 ENCOUNTER — Ambulatory Visit: Payer: Medicare Other | Admitting: Cardiovascular Disease

## 2012-03-05 ENCOUNTER — Ambulatory Visit: Payer: Medicare Other | Admitting: Cardiovascular Disease

## 2012-03-07 ENCOUNTER — Encounter (HOSPITAL_COMMUNITY): Payer: Self-pay | Admitting: Emergency Medicine

## 2012-03-07 ENCOUNTER — Emergency Department (HOSPITAL_COMMUNITY): Payer: Medicare Other

## 2012-03-07 ENCOUNTER — Emergency Department (HOSPITAL_COMMUNITY)
Admission: EM | Admit: 2012-03-07 | Discharge: 2012-03-07 | Disposition: A | Discharge status: Home / Self Care | Payer: Medicare Other | Attending: Emergency Medicine | Admitting: Emergency Medicine

## 2012-03-07 DIAGNOSIS — R404 Transient alteration of awareness: Secondary | ICD-10-CM | POA: Insufficient documentation

## 2012-03-07 DIAGNOSIS — R4789 Other speech disturbances: Secondary | ICD-10-CM | POA: Insufficient documentation

## 2012-03-07 DIAGNOSIS — I10 Essential (primary) hypertension: Secondary | ICD-10-CM | POA: Insufficient documentation

## 2012-03-07 DIAGNOSIS — F1910 Other psychoactive substance abuse, uncomplicated: Secondary | ICD-10-CM

## 2012-03-07 DIAGNOSIS — R5381 Other malaise: Secondary | ICD-10-CM | POA: Insufficient documentation

## 2012-03-07 DIAGNOSIS — R401 Stupor: Secondary | ICD-10-CM

## 2012-03-07 DIAGNOSIS — Z79899 Other long term (current) drug therapy: Secondary | ICD-10-CM | POA: Insufficient documentation

## 2012-03-07 LAB — URINALYSIS, ROUTINE W REFLEX MICROSCOPIC
Bilirubin Urine: NEGATIVE
Ketones, ur: NEGATIVE mg/dL
Leukocytes, UA: NEGATIVE
Nitrite: NEGATIVE
Specific Gravity, Urine: >1.03 — ABNORMAL HIGH (ref 1.005–1.030)
Urobilinogen, UA: 0.2 mg/dL (ref 0.0–1.0)

## 2012-03-07 LAB — BASIC METABOLIC PANEL
CO2: 26 mEq/L (ref 19–32)
Chloride: 101 mEq/L (ref 96–112)
GFR calc Af Amer: 81 mL/min — ABNORMAL LOW (ref >90)
Potassium: 4 mEq/L (ref 3.5–5.1)
Sodium: 135 mEq/L (ref 135–145)

## 2012-03-07 LAB — TSH: TSH: 1.837 u[IU]/mL (ref 0.350–4.500)

## 2012-03-07 LAB — HEPATIC FUNCTION PANEL
ALT: 13 U/L (ref 0–53)
AST: 15 U/L (ref 0–37)
Albumin: 3.8 g/dL (ref 3.5–5.2)
Alkaline Phosphatase: 56 U/L (ref 39–117)
Bilirubin, Direct: 0.1 mg/dL (ref 0.0–0.3)
Total Bilirubin: 0.1 mg/dL — ABNORMAL LOW (ref 0.3–1.2)

## 2012-03-07 LAB — CBC WITH DIFFERENTIAL/PLATELET
Basophils Absolute: 0 10*3/uL (ref 0.0–0.1)
Basophils Relative: 0 % (ref 0–1)
HCT: 33.9 % — ABNORMAL LOW (ref 39.0–52.0)
Lymphocytes Relative: 37 % (ref 12–46)
Monocytes Absolute: 0.5 10*3/uL (ref 0.1–1.0)
Neutro Abs: 2.9 10*3/uL (ref 1.7–7.7)
Neutrophils Relative %: 51 % (ref 43–77)
Platelets: 223 10*3/uL (ref 150–400)
RDW: 13.9 % (ref 11.5–15.5)
WBC: 5.8 10*3/uL (ref 4.0–10.5)

## 2012-03-07 LAB — RAPID URINE DRUG SCREEN, HOSP PERFORMED
Amphetamines: NOT DETECTED
Cocaine: POSITIVE — AB
Opiates: NOT DETECTED
Tetrahydrocannabinol: POSITIVE — AB

## 2012-03-07 LAB — MAGNESIUM: Magnesium: 2.1 mg/dL (ref 1.5–2.5)

## 2012-03-07 LAB — ACETAMINOPHEN LEVEL: Acetaminophen (Tylenol), Serum: <15 ug/mL (ref 10–30)

## 2012-03-07 MED ORDER — ONDANSETRON HCL 4 MG/2ML IJ SOLN
4.0000 mg | Freq: Once | INTRAMUSCULAR | Status: AC
  Administered 2012-03-07: 4 mg via INTRAVENOUS
  Filled 2012-03-07: qty 2

## 2012-03-07 MED ORDER — SODIUM CHLORIDE 0.9 % IV BOLUS (SEPSIS)
250.0000 mL | Freq: Once | INTRAVENOUS | Status: AC
  Administered 2012-03-07: 250 mL via INTRAVENOUS

## 2012-03-07 MED ORDER — SODIUM CHLORIDE 0.9 % IV SOLN
INTRAVENOUS | Status: DC
  Administered 2012-03-07: 14:00:00 via INTRAVENOUS

## 2012-03-07 MED ORDER — NALOXONE HCL 0.4 MG/ML IJ SOLN
0.4000 mg | Freq: Once | INTRAMUSCULAR | Status: AC
  Administered 2012-03-07: 0.4 mg via INTRAVENOUS
  Filled 2012-03-07: qty 1

## 2012-03-07 MED ORDER — SODIUM CHLORIDE 0.9 % IV BOLUS (SEPSIS)
1000.0000 mL | Freq: Once | INTRAVENOUS | Status: AC
  Administered 2012-03-07: 1000 mL via INTRAVENOUS

## 2012-03-07 NOTE — ED Notes (Signed)
MD at bedside. 

## 2012-03-07 NOTE — ED Notes (Signed)
Patient lethargic, but arouses to verbal stimuli. Oriented x 4. Denies any extracurricular drug use, ETOH use, or taking too much of his prescribed medication. Patient reports being weak since 1150 today. History of seizures, family member states no "shaking seizures" today. VSS at present.

## 2012-03-07 NOTE — ED Notes (Signed)
Patient found standing in room using urinal. Urine specimen obtained.
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
## 2012-03-07 NOTE — ED Notes (Signed)
Pt had sudden onset of overall weakness that started at 1150. Per family the neurologist states he has been having silent seizures. Per family pt has not had any shaking episodes today.

## 2012-03-07 NOTE — ED Notes (Signed)
Patient left Department without telling staff or waiting for discharge papers. Searched room for IV catheter, not found upon search. IV tubing for fluids with fluids still running on pump found on bed, but IV catheter not found on bed, floor, or trash can. [employer] notified and will complete a well check on the patient.

## 2012-03-26 ENCOUNTER — Ambulatory Visit: Payer: Self-pay | Admitting: Orthopedic Surgery

## 2012-06-05 ENCOUNTER — Emergency Department (HOSPITAL_COMMUNITY)
Admission: EM | Admit: 2012-06-05 | Discharge: 2012-06-05 | Disposition: A | Discharge status: Home / Self Care | Payer: Medicare Other | Attending: Emergency Medicine | Admitting: Emergency Medicine

## 2012-06-05 ENCOUNTER — Encounter (HOSPITAL_COMMUNITY): Payer: Self-pay

## 2012-06-05 DIAGNOSIS — Z79899 Other long term (current) drug therapy: Secondary | ICD-10-CM | POA: Insufficient documentation

## 2012-06-05 DIAGNOSIS — I10 Essential (primary) hypertension: Secondary | ICD-10-CM | POA: Insufficient documentation

## 2012-06-05 DIAGNOSIS — F1210 Cannabis abuse, uncomplicated: Secondary | ICD-10-CM | POA: Insufficient documentation

## 2012-06-05 DIAGNOSIS — F209 Schizophrenia, unspecified: Secondary | ICD-10-CM | POA: Insufficient documentation

## 2012-06-05 DIAGNOSIS — Z7982 Long term (current) use of aspirin: Secondary | ICD-10-CM | POA: Insufficient documentation

## 2012-06-05 DIAGNOSIS — G40309 Generalized idiopathic epilepsy and epileptic syndromes, not intractable, without status epilepticus: Secondary | ICD-10-CM | POA: Insufficient documentation

## 2012-06-05 DIAGNOSIS — G8929 Other chronic pain: Secondary | ICD-10-CM | POA: Insufficient documentation

## 2012-06-05 DIAGNOSIS — F329 Major depressive disorder, single episode, unspecified: Secondary | ICD-10-CM | POA: Insufficient documentation

## 2012-06-05 DIAGNOSIS — F319 Bipolar disorder, unspecified: Secondary | ICD-10-CM | POA: Insufficient documentation

## 2012-06-05 DIAGNOSIS — G40409 Other generalized epilepsy and epileptic syndromes, not intractable, without status epilepticus: Secondary | ICD-10-CM

## 2012-06-05 DIAGNOSIS — F1410 Cocaine abuse, uncomplicated: Secondary | ICD-10-CM | POA: Insufficient documentation

## 2012-06-05 DIAGNOSIS — F3289 Other specified depressive episodes: Secondary | ICD-10-CM | POA: Insufficient documentation

## 2012-06-05 DIAGNOSIS — Z87442 Personal history of urinary calculi: Secondary | ICD-10-CM | POA: Insufficient documentation

## 2012-06-05 DIAGNOSIS — F17200 Nicotine dependence, unspecified, uncomplicated: Secondary | ICD-10-CM | POA: Insufficient documentation

## 2012-06-05 HISTORY — DX: Unspecified convulsions: R56.9

## 2012-06-05 MED ORDER — VALPROATE SODIUM 500 MG/5ML IV SOLN
INTRAVENOUS | Status: AC
  Filled 2012-06-05: qty 10

## 2012-06-05 MED ORDER — VALPROATE SODIUM 500 MG/5ML IV SOLN
1000.0000 mg | INTRAVENOUS | Status: AC
  Administered 2012-06-05: 1000 mg via INTRAVENOUS
  Filled 2012-06-05: qty 10

## 2012-06-05 MED ORDER — DIVALPROEX SODIUM 500 MG PO DR TAB: 500.0000 mg | DELAYED_RELEASE_TABLET | Freq: Two times a day (BID) | ORAL | Status: DC

## 2012-06-05 NOTE — ED Notes (Signed)
Fully alert, eating and drinking snack. IV infusing well on pump.
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
## 2012-06-05 NOTE — ED Notes (Signed)
Per ems, pt's g/f states that pt has not been taking his seizure medication for the past 2 mos.
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
## 2012-06-05 NOTE — ED Notes (Signed)
Called AC to bring medicine.  Pt aware of wait.  nad noted

## 2012-09-12 DIAGNOSIS — Z87442 Personal history of urinary calculi: Secondary | ICD-10-CM | POA: Insufficient documentation

## 2012-09-12 DIAGNOSIS — Z79899 Other long term (current) drug therapy: Secondary | ICD-10-CM | POA: Insufficient documentation

## 2012-09-12 DIAGNOSIS — G8929 Other chronic pain: Secondary | ICD-10-CM | POA: Insufficient documentation

## 2012-09-12 DIAGNOSIS — F319 Bipolar disorder, unspecified: Secondary | ICD-10-CM | POA: Insufficient documentation

## 2012-09-12 DIAGNOSIS — Z8739 Personal history of other diseases of the musculoskeletal system and connective tissue: Secondary | ICD-10-CM | POA: Insufficient documentation

## 2012-09-12 DIAGNOSIS — M5430 Sciatica, unspecified side: Secondary | ICD-10-CM | POA: Insufficient documentation

## 2012-09-12 DIAGNOSIS — F209 Schizophrenia, unspecified: Secondary | ICD-10-CM | POA: Insufficient documentation

## 2012-09-12 DIAGNOSIS — Z8679 Personal history of other diseases of the circulatory system: Secondary | ICD-10-CM | POA: Insufficient documentation

## 2012-09-12 DIAGNOSIS — Z8781 Personal history of (healed) traumatic fracture: Secondary | ICD-10-CM | POA: Insufficient documentation

## 2012-09-12 DIAGNOSIS — F17200 Nicotine dependence, unspecified, uncomplicated: Secondary | ICD-10-CM | POA: Insufficient documentation

## 2012-09-12 DIAGNOSIS — Z7982 Long term (current) use of aspirin: Secondary | ICD-10-CM | POA: Insufficient documentation

## 2012-09-12 DIAGNOSIS — I10 Essential (primary) hypertension: Secondary | ICD-10-CM | POA: Insufficient documentation

## 2012-09-12 DIAGNOSIS — G40909 Epilepsy, unspecified, not intractable, without status epilepticus: Secondary | ICD-10-CM | POA: Insufficient documentation

## 2012-09-12 DIAGNOSIS — M79609 Pain in unspecified limb: Secondary | ICD-10-CM | POA: Insufficient documentation

## 2012-09-13 ENCOUNTER — Emergency Department (HOSPITAL_COMMUNITY)
Admission: EM | Admit: 2012-09-13 | Discharge: 2012-09-13 | Disposition: A | Discharge status: Home / Self Care | Payer: Medicare Other | Attending: Emergency Medicine | Admitting: Emergency Medicine

## 2012-09-13 ENCOUNTER — Encounter (HOSPITAL_COMMUNITY): Payer: Self-pay | Admitting: Emergency Medicine

## 2012-09-13 DIAGNOSIS — M5430 Sciatica, unspecified side: Secondary | ICD-10-CM

## 2012-09-13 MED ORDER — HYDROCODONE-ACETAMINOPHEN 5-325 MG PO TABS
1.0000 | ORAL_TABLET | Freq: Once | ORAL | Status: AC
  Administered 2012-09-13: 1 via ORAL
  Filled 2012-09-13: qty 1

## 2012-09-13 MED ORDER — HYDROCODONE-ACETAMINOPHEN 5-325 MG PO TABS: 1.0000 | ORAL_TABLET | ORAL | Status: DC | PRN

## 2012-09-13 MED ORDER — PREDNISONE 50 MG PO TABS
60.0000 mg | ORAL_TABLET | Freq: Once | ORAL | Status: AC
  Administered 2012-09-13: 60 mg via ORAL
  Filled 2012-09-13: qty 1

## 2012-09-13 MED ORDER — PREDNISONE 10 MG PO TABS: 20.0000 mg | ORAL_TABLET | Freq: Every day | ORAL | Status: DC
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
## 2013-01-26 ENCOUNTER — Emergency Department (HOSPITAL_COMMUNITY)
Admission: EM | Admit: 2013-01-26 | Discharge: 2013-01-27 | Disposition: A | Discharge status: Home / Self Care | Payer: Medicare Other | Attending: Emergency Medicine | Admitting: Emergency Medicine

## 2013-01-26 ENCOUNTER — Encounter (HOSPITAL_COMMUNITY): Payer: Self-pay | Admitting: *Deleted

## 2013-01-26 DIAGNOSIS — R319 Hematuria, unspecified: Secondary | ICD-10-CM | POA: Insufficient documentation

## 2013-01-26 DIAGNOSIS — I10 Essential (primary) hypertension: Secondary | ICD-10-CM | POA: Insufficient documentation

## 2013-01-26 DIAGNOSIS — Z7982 Long term (current) use of aspirin: Secondary | ICD-10-CM | POA: Insufficient documentation

## 2013-01-26 DIAGNOSIS — G43909 Migraine, unspecified, not intractable, without status migrainosus: Secondary | ICD-10-CM | POA: Insufficient documentation

## 2013-01-26 DIAGNOSIS — Z87442 Personal history of urinary calculi: Secondary | ICD-10-CM | POA: Insufficient documentation

## 2013-01-26 DIAGNOSIS — F17200 Nicotine dependence, unspecified, uncomplicated: Secondary | ICD-10-CM | POA: Insufficient documentation

## 2013-01-26 DIAGNOSIS — R110 Nausea: Secondary | ICD-10-CM | POA: Insufficient documentation

## 2013-01-26 DIAGNOSIS — G8929 Other chronic pain: Secondary | ICD-10-CM | POA: Insufficient documentation

## 2013-01-26 DIAGNOSIS — F209 Schizophrenia, unspecified: Secondary | ICD-10-CM | POA: Insufficient documentation

## 2013-01-26 DIAGNOSIS — M129 Arthropathy, unspecified: Secondary | ICD-10-CM | POA: Insufficient documentation

## 2013-01-26 DIAGNOSIS — Z79899 Other long term (current) drug therapy: Secondary | ICD-10-CM | POA: Insufficient documentation

## 2013-01-26 DIAGNOSIS — Z8669 Personal history of other diseases of the nervous system and sense organs: Secondary | ICD-10-CM | POA: Insufficient documentation

## 2013-01-26 DIAGNOSIS — R109 Unspecified abdominal pain: Secondary | ICD-10-CM

## 2013-01-26 DIAGNOSIS — F319 Bipolar disorder, unspecified: Secondary | ICD-10-CM | POA: Insufficient documentation

## 2013-01-26 LAB — URINALYSIS, ROUTINE W REFLEX MICROSCOPIC
Nitrite: NEGATIVE
Protein, ur: 100 mg/dL — AB
Urobilinogen, UA: 0.2 mg/dL (ref 0.0–1.0)

## 2013-01-26 MED ORDER — ONDANSETRON 4 MG PO TBDP
4.0000 mg | ORAL_TABLET | Freq: Once | ORAL | Status: AC
  Administered 2013-01-27: 4 mg via ORAL
  Filled 2013-01-26: qty 1

## 2013-01-26 MED ORDER — HYDROMORPHONE HCL PF 1 MG/ML IJ SOLN
1.0000 mg | Freq: Once | INTRAMUSCULAR | Status: AC
  Administered 2013-01-27: 1 mg via INTRAVENOUS
  Filled 2013-01-26: qty 1

## 2013-01-26 NOTE — ED Notes (Signed)
Pt reporting pain on left side.  States he has a history of kidney stones and feels like he is passing another. Reporting blood in urine.   Reporting some nausea.
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
## 2013-01-27 LAB — CBC WITH DIFFERENTIAL/PLATELET
Basophils Absolute: 0.1 10*3/uL (ref 0.0–0.1)
Lymphocytes Relative: 26 % (ref 12–46)
Neutro Abs: 7.1 10*3/uL (ref 1.7–7.7)
Platelets: 221 10*3/uL (ref 150–400)
RDW: 14.6 % (ref 11.5–15.5)
WBC: 11.1 10*3/uL — ABNORMAL HIGH (ref 4.0–10.5)

## 2013-01-27 LAB — BASIC METABOLIC PANEL
CO2: 25 mEq/L (ref 19–32)
Glucose, Bld: 107 mg/dL — ABNORMAL HIGH (ref 70–99)
Potassium: 3.8 mEq/L (ref 3.5–5.1)
Sodium: 140 mEq/L (ref 135–145)

## 2013-01-27 MED ORDER — HYDROCODONE-ACETAMINOPHEN 5-325 MG PO TABS: 1.0000 | ORAL_TABLET | ORAL | Status: DC | PRN

## 2013-01-27 MED ORDER — OXYCODONE-ACETAMINOPHEN 5-325 MG PO TABS
1.0000 | ORAL_TABLET | Freq: Once | ORAL | Status: AC
  Administered 2013-01-27: 1 via ORAL
  Filled 2013-01-27: qty 1

## 2013-02-22 ENCOUNTER — Emergency Department (HOSPITAL_COMMUNITY)
Admission: EM | Admit: 2013-02-22 | Discharge: 2013-02-22 | Disposition: A | Discharge status: Home / Self Care | Payer: Medicare Other | Attending: Emergency Medicine | Admitting: Emergency Medicine

## 2013-02-22 ENCOUNTER — Emergency Department (HOSPITAL_COMMUNITY): Payer: Medicare Other

## 2013-02-22 ENCOUNTER — Encounter (HOSPITAL_COMMUNITY): Payer: Self-pay

## 2013-02-22 DIAGNOSIS — N23 Unspecified renal colic: Secondary | ICD-10-CM | POA: Insufficient documentation

## 2013-02-22 DIAGNOSIS — F3289 Other specified depressive episodes: Secondary | ICD-10-CM | POA: Insufficient documentation

## 2013-02-22 DIAGNOSIS — R05 Cough: Secondary | ICD-10-CM | POA: Insufficient documentation

## 2013-02-22 DIAGNOSIS — Z8669 Personal history of other diseases of the nervous system and sense organs: Secondary | ICD-10-CM | POA: Insufficient documentation

## 2013-02-22 DIAGNOSIS — F329 Major depressive disorder, single episode, unspecified: Secondary | ICD-10-CM | POA: Insufficient documentation

## 2013-02-22 DIAGNOSIS — F209 Schizophrenia, unspecified: Secondary | ICD-10-CM | POA: Insufficient documentation

## 2013-02-22 DIAGNOSIS — R300 Dysuria: Secondary | ICD-10-CM | POA: Insufficient documentation

## 2013-02-22 DIAGNOSIS — R059 Cough, unspecified: Secondary | ICD-10-CM | POA: Insufficient documentation

## 2013-02-22 DIAGNOSIS — Z79899 Other long term (current) drug therapy: Secondary | ICD-10-CM | POA: Insufficient documentation

## 2013-02-22 DIAGNOSIS — Z8679 Personal history of other diseases of the circulatory system: Secondary | ICD-10-CM | POA: Insufficient documentation

## 2013-02-22 DIAGNOSIS — Z8659 Personal history of other mental and behavioral disorders: Secondary | ICD-10-CM | POA: Insufficient documentation

## 2013-02-22 DIAGNOSIS — F17200 Nicotine dependence, unspecified, uncomplicated: Secondary | ICD-10-CM | POA: Insufficient documentation

## 2013-02-22 DIAGNOSIS — I10 Essential (primary) hypertension: Secondary | ICD-10-CM | POA: Insufficient documentation

## 2013-02-22 DIAGNOSIS — R635 Abnormal weight gain: Secondary | ICD-10-CM | POA: Insufficient documentation

## 2013-02-22 DIAGNOSIS — R319 Hematuria, unspecified: Secondary | ICD-10-CM | POA: Insufficient documentation

## 2013-02-22 DIAGNOSIS — R112 Nausea with vomiting, unspecified: Secondary | ICD-10-CM | POA: Insufficient documentation

## 2013-02-22 DIAGNOSIS — Z87828 Personal history of other (healed) physical injury and trauma: Secondary | ICD-10-CM | POA: Insufficient documentation

## 2013-02-22 DIAGNOSIS — R6883 Chills (without fever): Secondary | ICD-10-CM | POA: Insufficient documentation

## 2013-02-22 DIAGNOSIS — Z87442 Personal history of urinary calculi: Secondary | ICD-10-CM | POA: Insufficient documentation

## 2013-02-22 DIAGNOSIS — M129 Arthropathy, unspecified: Secondary | ICD-10-CM | POA: Insufficient documentation

## 2013-02-22 LAB — URINALYSIS, ROUTINE W REFLEX MICROSCOPIC
Leukocytes, UA: NEGATIVE
Nitrite: NEGATIVE
Specific Gravity, Urine: >1.03 — ABNORMAL HIGH (ref 1.005–1.030)
Urobilinogen, UA: 0.2 mg/dL (ref 0.0–1.0)
pH: 5.5 (ref 5.0–8.0)

## 2013-02-22 MED ORDER — SODIUM CHLORIDE 0.9 % IV BOLUS (SEPSIS)
1000.0000 mL | Freq: Once | INTRAVENOUS | Status: AC
  Administered 2013-02-22: 1000 mL via INTRAVENOUS

## 2013-02-22 MED ORDER — HYDROMORPHONE HCL PF 2 MG/ML IJ SOLN
4.0000 mg | Freq: Once | INTRAMUSCULAR | Status: AC
  Administered 2013-02-22: 4 mg via INTRAMUSCULAR
  Filled 2013-02-22: qty 2

## 2013-02-22 MED ORDER — KETOROLAC TROMETHAMINE 30 MG/ML IJ SOLN
30.0000 mg | Freq: Once | INTRAMUSCULAR | Status: DC
  Filled 2013-02-22: qty 1

## 2013-02-22 MED ORDER — ONDANSETRON HCL 4 MG/2ML IJ SOLN
4.0000 mg | Freq: Once | INTRAMUSCULAR | Status: AC
  Administered 2013-02-22: 4 mg via INTRAVENOUS
  Filled 2013-02-22: qty 2

## 2013-02-22 MED ORDER — OXYCODONE-ACETAMINOPHEN 5-325 MG PO TABS: 1.0000 | ORAL_TABLET | ORAL | Status: DC | PRN

## 2013-02-22 MED ORDER — ONDANSETRON 8 MG PO TBDP: 8.0000 mg | ORAL_TABLET | Freq: Three times a day (TID) | ORAL | Status: DC | PRN

## 2013-02-22 MED ORDER — KETOROLAC TROMETHAMINE 60 MG/2ML IM SOLN
60.0000 mg | Freq: Once | INTRAMUSCULAR | Status: AC
  Administered 2013-02-22: 60 mg via INTRAMUSCULAR
  Filled 2013-02-22: qty 2

## 2013-02-22 MED ORDER — HYDROMORPHONE HCL PF 1 MG/ML IJ SOLN
1.0000 mg | Freq: Once | INTRAMUSCULAR | Status: DC
  Filled 2013-02-22: qty 1

## 2013-02-22 NOTE — ED Notes (Signed)
Pt reports left flank pain over the last month, pain became severe last ngiht. +nausea, +vomiting, denies any blood in his urine.
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
## 2013-03-18 ENCOUNTER — Ambulatory Visit: Payer: Self-pay | Admitting: Urology

## 2013-08-10 ENCOUNTER — Encounter (HOSPITAL_COMMUNITY): Payer: Self-pay | Admitting: Emergency Medicine

## 2013-08-10 ENCOUNTER — Emergency Department (HOSPITAL_COMMUNITY)
Admission: EM | Admit: 2013-08-10 | Discharge: 2013-08-10 | Disposition: A | Discharge status: Home / Self Care | Payer: Medicare Other | Attending: Emergency Medicine | Admitting: Emergency Medicine

## 2013-08-10 ENCOUNTER — Emergency Department (HOSPITAL_COMMUNITY): Payer: Medicare Other

## 2013-08-10 DIAGNOSIS — I10 Essential (primary) hypertension: Secondary | ICD-10-CM | POA: Insufficient documentation

## 2013-08-10 DIAGNOSIS — Z79899 Other long term (current) drug therapy: Secondary | ICD-10-CM | POA: Insufficient documentation

## 2013-08-10 DIAGNOSIS — M129 Arthropathy, unspecified: Secondary | ICD-10-CM | POA: Insufficient documentation

## 2013-08-10 DIAGNOSIS — F209 Schizophrenia, unspecified: Secondary | ICD-10-CM | POA: Insufficient documentation

## 2013-08-10 DIAGNOSIS — F319 Bipolar disorder, unspecified: Secondary | ICD-10-CM | POA: Insufficient documentation

## 2013-08-10 DIAGNOSIS — G43909 Migraine, unspecified, not intractable, without status migrainosus: Secondary | ICD-10-CM | POA: Insufficient documentation

## 2013-08-10 DIAGNOSIS — N200 Calculus of kidney: Secondary | ICD-10-CM | POA: Insufficient documentation

## 2013-08-10 DIAGNOSIS — G8929 Other chronic pain: Secondary | ICD-10-CM | POA: Insufficient documentation

## 2013-08-10 DIAGNOSIS — F17200 Nicotine dependence, unspecified, uncomplicated: Secondary | ICD-10-CM | POA: Insufficient documentation

## 2013-08-10 DIAGNOSIS — G40909 Epilepsy, unspecified, not intractable, without status epilepticus: Secondary | ICD-10-CM | POA: Insufficient documentation

## 2013-08-10 DIAGNOSIS — N390 Urinary tract infection, site not specified: Secondary | ICD-10-CM | POA: Insufficient documentation

## 2013-08-10 LAB — BASIC METABOLIC PANEL
BUN: 25 mg/dL — ABNORMAL HIGH (ref 6–23)
CHLORIDE: 102 meq/L (ref 96–112)
CO2: 22 meq/L (ref 19–32)
CREATININE: 1.16 mg/dL (ref 0.50–1.35)
Calcium: 9.2 mg/dL (ref 8.4–10.5)
GFR calc Af Amer: >90 mL/min (ref >90)
GFR calc non Af Amer: 78 mL/min — ABNORMAL LOW (ref >90)
GLUCOSE: 96 mg/dL (ref 70–99)
Potassium: 4.1 mEq/L (ref 3.7–5.3)
Sodium: 141 mEq/L (ref 137–147)

## 2013-08-10 LAB — URINALYSIS, ROUTINE W REFLEX MICROSCOPIC
Bilirubin Urine: NEGATIVE
GLUCOSE, UA: NEGATIVE mg/dL
KETONES UR: NEGATIVE mg/dL
LEUKOCYTES UA: NEGATIVE
Nitrite: NEGATIVE
PH: 5.5 (ref 5.0–8.0)
Protein, ur: 30 mg/dL — AB
Specific Gravity, Urine: >1.03 — ABNORMAL HIGH (ref 1.005–1.030)
Urobilinogen, UA: 0.2 mg/dL (ref 0.0–1.0)

## 2013-08-10 LAB — URINE MICROSCOPIC-ADD ON

## 2013-08-10 MED ORDER — KETOROLAC TROMETHAMINE 30 MG/ML IJ SOLN
30.0000 mg | Freq: Once | INTRAMUSCULAR | Status: AC
  Administered 2013-08-10: 30 mg via INTRAVENOUS
  Filled 2013-08-10: qty 1

## 2013-08-10 MED ORDER — PROMETHAZINE HCL 25 MG PO TABS: 25.0000 mg | ORAL_TABLET | Freq: Four times a day (QID) | ORAL | Status: DC | PRN

## 2013-08-10 MED ORDER — NAPROXEN 500 MG PO TABS: 500.0000 mg | ORAL_TABLET | Freq: Two times a day (BID) | ORAL | Status: DC

## 2013-08-10 MED ORDER — SODIUM CHLORIDE 0.9 % IV BOLUS (SEPSIS)
1000.0000 mL | Freq: Once | INTRAVENOUS | Status: AC
  Administered 2013-08-10: 1000 mL via INTRAVENOUS

## 2013-08-10 MED ORDER — CEPHALEXIN 500 MG PO CAPS: 500.0000 mg | ORAL_CAPSULE | Freq: Four times a day (QID) | ORAL | Status: DC

## 2013-08-10 MED ORDER — OXYCODONE-ACETAMINOPHEN 5-325 MG PO TABS: 1.0000 | ORAL_TABLET | ORAL | Status: DC | PRN

## 2013-08-10 MED ORDER — TAMSULOSIN HCL 0.4 MG PO CAPS: 0.4000 mg | ORAL_CAPSULE | Freq: Two times a day (BID) | ORAL | Status: DC

## 2013-08-10 MED ORDER — ONDANSETRON HCL 4 MG/2ML IJ SOLN
4.0000 mg | Freq: Once | INTRAMUSCULAR | Status: AC
  Administered 2013-08-10: 4 mg via INTRAVENOUS
  Filled 2013-08-10: qty 2

## 2013-08-10 MED ORDER — HYDROMORPHONE HCL PF 1 MG/ML IJ SOLN
1.0000 mg | Freq: Once | INTRAMUSCULAR | Status: AC
  Administered 2013-08-10: 1 mg via INTRAVENOUS
  Filled 2013-08-10: qty 1

## 2013-08-10 NOTE — Discharge Instructions (Signed)
You have a 4mm kidney stone that is close to passing  Your exam and or your xrays have shown that you likely have a kidney stone.  You should follow up with the Urologist of your choosing or the Urologist listed above in the next 2-3 days if you have not passed the stone.  You should urinate in to the strainer until you pass the stone.    Flomax helps with passing the stone by opening up the Ureters (tubes), Vicodin and an antiinflammatory for pain if you are not allergic to these medicines.  Phenergan or Zofran for nausea.  Return to the ER for severe or worsening pain, vomiting or fevers or if you are unable to control your pain with the medicines provided.  Kidney Stones Kidney stones (ureteral lithiasis) are deposits that form inside your kidneys. The intense pain is caused by the stone moving through the urinary tract. When the stone moves, the ureter goes into spasm around the stone. The stone is usually passed in the urine.  CAUSES   A disorder that makes certain neck glands produce too much parathyroid hormone (primary hyperparathyroidism).   A buildup of uric acid crystals.   Narrowing (stricture) of the ureter.   A kidney obstruction present at birth (congenital obstruction).   Previous surgery on the kidney or ureters.   Numerous kidney infections.  SYMPTOMS   Feeling sick to your stomach (nauseous).   Throwing up (vomiting).   Blood in the urine (hematuria).   Pain that usually spreads (radiates) to the groin.   Frequency or urgency of urination.  DIAGNOSIS   Taking a history and physical exam.   Blood or urine tests.   Computerized X-ray scan (CT scan).   Occasionally, an examination of the inside of the urinary bladder (cystoscopy) is performed.  TREATMENT   Observation.   Increasing your fluid intake.   Surgery may be needed if you have severe pain or persistent obstruction.  The size, location, and chemical composition are all important variables that  will determine the proper choice of action for you. Talk to your caregiver to better understand your situation so that you will minimize the risk of injury to yourself and your kidney.  HOME CARE INSTRUCTIONS   Drink enough water and fluids to keep your urine clear or pale yellow.   Strain all urine through the provided strainer. Keep all particulate matter and stones for your caregiver to see. The stone causing the pain may be as small as a grain of salt. It is very important to use the strainer each and every time you pass your urine. The collection of your stone will allow your caregiver to analyze it and verify that a stone has actually passed.   Only take over-the-counter or prescription medicines for pain, discomfort, or fever as directed by your caregiver.   Make a follow-up appointment with your caregiver as directed.   Get follow-up X-rays if required. The absence of pain does not always mean that the stone has passed. It may have only stopped moving. If the urine remains completely obstructed, it can cause loss of kidney function or even complete destruction of the kidney. It is your responsibility to make sure X-rays and follow-ups are completed. Ultrasounds of the kidney can show blockages and the status of the kidney. Ultrasounds are not associated with any radiation and can be performed easily in a matter of minutes.  SEEK IMMEDIATE MEDICAL CARE IF:   Pain cannot be controlled with the  prescribed medicine.   You have a fever.   The severity or intensity of pain increases over 18 hours and is not relieved by pain medicine.   You develop a new onset of abdominal pain.   You feel faint or pass out.  MAKE SURE YOU:   Understand these instructions.   Will watch your condition.   Will get help right away if you are not doing well or get worse.  Document Released: 07/15/2005 Document Revised: 07/04/2011 Document Reviewed: 11/10/2009 [facility] Patient Information 2012 [place],  [place].  RESOURCE GUIDE  Chronic Pain Problems: Contact [facility]  [phone] Patients need to be referred by their primary care doctor.  Insufficient Money for Medicine: Contact United Way:  call "211" or [facility] 870-801-9285.  No Primary Care Doctor: - Call [facility]  [phone] - can help you locate a primary care doctor that  accepts your insurance, provides certain services, etc. - Physician Referral Service- [phone]  Agencies that provide inexpensive medical care: - [facility]  [phone] - [facility]  [phone] - [facility]  [phone] - Women's Clinic  [phone] - Planned Parenthood  [phone] [facility]  [phone]  Medicaid-accepting [facility] Providers: - [facility]- [address]  [phone], Mon-Fri 9am-7pm, Sat 9am-1pm - [facility]- [address] [place], [address] [place]  [phone] - [facility]- [address] [place]  [phone] [facility]- [address]  [phone] - Renaye Rakers- [address] [place], [address], [phone]  Only accepts [place] patients after they have their name  applied to their card  Self Pay (no insurance) in [place]: - Sickle Cell Patients: Dr Willey Blade, [facility]  [address] [place], [phone] - [facility]- [address] [place]  [phone]       [facility] [place]- [address]       -     [facility]- see information above (Speak to [employer] if you do not have insurance)       -  [facility]- [address] [place], [phone]       -  [facility]- [address],  [phone]       -  [facility]- [address] [phone]       -  Dr Julio Sicks-  [address], [place], [phone]       -  [facility]-  [address] [phone]       -  [facility]- [address] [phone], also [address] [phone]       -    [facility]- [address] [place], [phone], 1st & 3rd Saturday   every month, 10am-1pm  1) Find a Doctor and Pay Out of Pocket Although you won't have to find out who is covered by your insurance plan, it is a good idea to ask around and get recommendations. You will then need to call the office and see if the doctor you have chosen will accept you as a new patient and what types of options they offer for patients who are self-pay. Some doctors offer discounts or will set up payment plans for  their patients who do not have insurance, but you will need to ask so you aren't surprised when you get to your appointment.  2) Contact Your Local Health Department Not all health departments have doctors that can see patients for sick visits, but many do, so it is worth a call to see if yours does. If you don't know where your local health department is, you can check in your phone book. The CDC also has a tool to help you locate your state's health department, and many state websites also have listings of all of their local health departments.  3) Find a Walk-in Clinic If your illness is not likely to be very severe or complicated, you may want to try a walk in clinic. These are popping up all over the country in pharmacies, drugstores, and shopping centers. They're usually staffed by nurse practitioners or physician assistants that have been trained to treat common illnesses and complaints. They're usually fairly quick and inexpensive. However, if you have serious medical issues or chronic medical problems, these are probably not your best option  STD Testing - [facility] [place], STD Clinic, [address], [place], phone [phone] or [phone].  Monday - Friday, call for an appointment. [facility]- [place]  Department of [facility] [place], STD Clinic, [place], [place], phone [phone] or [phone].  Monday - Friday, call for an appointment.  Abuse/Neglect: [facility]- [place] Child Abuse Hotline (817) 339-5403(336) (760)774-7672 [facility] [place] Child Abuse Hotline 612-610-00697318516839 (After Hours)  Emergency Shelter:  [facility] (512) 118-7436(336) 8072658001  Maternity Homes: - [facility] [phone](336) ([phone] - [facility] [phone](704) [phone]  MRSA Hotline #:   9862369613367-320-9233  [facility] Resources  [facility] [place] [place]  United Way [facility] [address]                 [address]         [address]  [place]                                               [place]                              [place] Phone:  [phone]                                  Phone:  [phone]                   Phone:  [phone]  [facility], [phone] - [facility] - [phone]- [phone]       -     [facility] in [place], [address],                                  [phone], Insurance  [place] Child Abuse Hotline (574) 611-6373(336) (702) 545-4003 or (847) 022-1506(336) [phone] (After Hours)   Behavioral Health Services  Substance Abuse Resources: - [facility] Drug [facility]  ([phone] - [facility] [phone] - [facility] [place]  [facility] [phone] Floydene Flock (978)[phone] - Residential & Outpatient Substance Abuse Program  2676707263  Psychological Services: [facility]  [phone] Services  [phone] - [facility], [phone] [place]. [address] [place], ACCESS LINE: 507-508-4955 or 4020541745, [employer]an.[email or web address]  Dental Assistance  If unable to pay or uninsured, contact:  [facility] or [facility]. to become qualified for the adult dental  clinic.  Patients with Medicaid: [facility] [phone] [address], [phone] [address] [phone]  If unable to pay, or uninsured, contact [facility] [phone]) or [facility] Department [phone] in [place], [phone] in [facility]) to become qualified for the adult dental clinic  Other Low-Cost Community Dental Services: - [facility]- [address] [place], [place], [place], [address], [phone], [address], 2nd and 4th Thursday of the month at 6:30am.  10 clients each day by appointment, can sometimes see walk-in patients if someone does not show for an appointment. [facility]- [address] [place], [place], [address], [phone] - [facility]- [address] [place], [place], [address], [phone] - [place] [facility]- [phone] [facility] Department- [phone] [facility] Department- [phone]
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
## 2013-08-10 NOTE — ED Notes (Addendum)
Patient complaining of left flank pain and groin pain x 1 day.

## 2013-08-11 LAB — URINE CULTURE
Colony Count: NO GROWTH
Culture: NO GROWTH

## 2013-09-22 ENCOUNTER — Encounter (HOSPITAL_COMMUNITY): Payer: Self-pay | Admitting: Pharmacy Technician

## 2013-09-23 ENCOUNTER — Emergency Department (HOSPITAL_COMMUNITY)
Admission: EM | Admit: 2013-09-23 | Discharge: 2013-09-23 | Disposition: A | Discharge status: Home / Self Care | Payer: Medicare Other | Attending: Emergency Medicine | Admitting: Emergency Medicine

## 2013-09-23 ENCOUNTER — Encounter (HOSPITAL_COMMUNITY): Payer: Self-pay | Admitting: Emergency Medicine

## 2013-09-23 ENCOUNTER — Emergency Department (HOSPITAL_COMMUNITY): Payer: Medicare Other

## 2013-09-23 DIAGNOSIS — Z880 Allergy status to penicillin: Secondary | ICD-10-CM | POA: Insufficient documentation

## 2013-09-23 DIAGNOSIS — G8929 Other chronic pain: Secondary | ICD-10-CM | POA: Insufficient documentation

## 2013-09-23 DIAGNOSIS — G43909 Migraine, unspecified, not intractable, without status migrainosus: Secondary | ICD-10-CM | POA: Insufficient documentation

## 2013-09-23 DIAGNOSIS — F319 Bipolar disorder, unspecified: Secondary | ICD-10-CM | POA: Insufficient documentation

## 2013-09-23 DIAGNOSIS — F17200 Nicotine dependence, unspecified, uncomplicated: Secondary | ICD-10-CM | POA: Insufficient documentation

## 2013-09-23 DIAGNOSIS — Z87828 Personal history of other (healed) physical injury and trauma: Secondary | ICD-10-CM | POA: Insufficient documentation

## 2013-09-23 DIAGNOSIS — G40909 Epilepsy, unspecified, not intractable, without status epilepticus: Secondary | ICD-10-CM | POA: Insufficient documentation

## 2013-09-23 DIAGNOSIS — N200 Calculus of kidney: Secondary | ICD-10-CM | POA: Insufficient documentation

## 2013-09-23 DIAGNOSIS — F209 Schizophrenia, unspecified: Secondary | ICD-10-CM | POA: Insufficient documentation

## 2013-09-23 DIAGNOSIS — Z79899 Other long term (current) drug therapy: Secondary | ICD-10-CM | POA: Insufficient documentation

## 2013-09-23 DIAGNOSIS — Z8739 Personal history of other diseases of the musculoskeletal system and connective tissue: Secondary | ICD-10-CM | POA: Insufficient documentation

## 2013-09-23 LAB — URINALYSIS, ROUTINE W REFLEX MICROSCOPIC
Bilirubin Urine: NEGATIVE
Glucose, UA: NEGATIVE mg/dL
Ketones, ur: 15 mg/dL — AB
LEUKOCYTES UA: NEGATIVE
Nitrite: NEGATIVE
Specific Gravity, Urine: 1.025 (ref 1.005–1.030)
UROBILINOGEN UA: 0.2 mg/dL (ref 0.0–1.0)
pH: 6.5 (ref 5.0–8.0)

## 2013-09-23 LAB — COMPREHENSIVE METABOLIC PANEL
ALT: 24 U/L (ref 0–53)
AST: 17 U/L (ref 0–37)
Albumin: 3.6 g/dL (ref 3.5–5.2)
Alkaline Phosphatase: 49 U/L (ref 39–117)
BILIRUBIN TOTAL: 0.2 mg/dL — AB (ref 0.3–1.2)
BUN: 17 mg/dL (ref 6–23)
CHLORIDE: 101 meq/L (ref 96–112)
CO2: 25 meq/L (ref 19–32)
CREATININE: 1.18 mg/dL (ref 0.50–1.35)
Calcium: 8.9 mg/dL (ref 8.4–10.5)
GFR calc Af Amer: 88 mL/min — ABNORMAL LOW (ref >90)
GFR, EST NON AFRICAN AMERICAN: 76 mL/min — AB (ref >90)
GLUCOSE: 127 mg/dL — AB (ref 70–99)
Potassium: 4.1 mEq/L (ref 3.7–5.3)
Sodium: 139 mEq/L (ref 137–147)
Total Protein: 7.4 g/dL (ref 6.0–8.3)

## 2013-09-23 LAB — CBC WITH DIFFERENTIAL/PLATELET
Basophils Absolute: 0 10*3/uL (ref 0.0–0.1)
Basophils Relative: 0 % (ref 0–1)
Eosinophils Absolute: 0 10*3/uL (ref 0.0–0.7)
Eosinophils Relative: 0 % (ref 0–5)
HEMATOCRIT: 36.2 % — AB (ref 39.0–52.0)
HEMOGLOBIN: 12.4 g/dL — AB (ref 13.0–17.0)
LYMPHS PCT: 8 % — AB (ref 12–46)
Lymphs Abs: 0.9 10*3/uL (ref 0.7–4.0)
MCH: 28.3 pg (ref 26.0–34.0)
MCHC: 34.3 g/dL (ref 30.0–36.0)
MCV: 82.6 fL (ref 78.0–100.0)
MONO ABS: 0.5 10*3/uL (ref 0.1–1.0)
MONOS PCT: 4 % (ref 3–12)
Neutro Abs: 10.7 10*3/uL — ABNORMAL HIGH (ref 1.7–7.7)
Neutrophils Relative %: 88 % — ABNORMAL HIGH (ref 43–77)
Platelets: 248 10*3/uL (ref 150–400)
RBC: 4.38 MIL/uL (ref 4.22–5.81)
RDW: 14 % (ref 11.5–15.5)
WBC: 12.2 10*3/uL — AB (ref 4.0–10.5)

## 2013-09-23 LAB — URINE MICROSCOPIC-ADD ON

## 2013-09-23 MED ORDER — HYDROMORPHONE HCL PF 1 MG/ML IJ SOLN
1.0000 mg | Freq: Once | INTRAMUSCULAR | Status: AC
  Administered 2013-09-23: 1 mg via INTRAVENOUS
  Filled 2013-09-23: qty 1

## 2013-09-23 MED ORDER — ONDANSETRON HCL 4 MG/2ML IJ SOLN
4.0000 mg | Freq: Once | INTRAMUSCULAR | Status: AC
  Administered 2013-09-23: 4 mg via INTRAVENOUS
  Filled 2013-09-23: qty 2

## 2013-09-23 MED ORDER — KETOROLAC TROMETHAMINE 30 MG/ML IJ SOLN
30.0000 mg | Freq: Once | INTRAMUSCULAR | Status: AC
  Administered 2013-09-23: 30 mg via INTRAVENOUS
  Filled 2013-09-23: qty 1

## 2013-09-23 MED ORDER — HYDROCODONE-ACETAMINOPHEN 5-325 MG PO TABS: 1.0000 | ORAL_TABLET | Freq: Four times a day (QID) | ORAL | Status: DC | PRN

## 2013-09-23 MED ORDER — ONDANSETRON 4 MG PO TBDP: ORAL_TABLET | ORAL | Status: DC

## 2013-09-23 NOTE — ED Notes (Signed)
Left flank pain with nausea, some radiation to groin

## 2013-09-23 NOTE — ED Notes (Signed)
Patient urinated into urinal, small black spec noted in bottom of urinal. Dr. Estell HarpinZammit notified of possible kidney stone passed. Patient denies any pain at this time.

## 2013-09-23 NOTE — Discharge Instructions (Signed)
Follow up with your family md next week. °
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
## 2013-09-28 NOTE — Patient Instructions (Signed)
Your procedure is scheduled on: 10/05/2013  [facility] at  730  AM.  Call this number if you have problems the morning of surgery: [phone]   Do not eat food or drink liquids :After Midnight.      Take these medicines the morning of surgery with A SIP OF WATER: xanax, abilify, depakote, cymbalta, hydrocodone, lisinopril, prilosec, zofran   Do not wear jewelry, make-up or nail polish.  Do not wear lotions, powders, or perfumes.   Do not shave 48 hours prior to surgery.  Do not bring valuables to the hospital.  Contacts, dentures or bridgework may not be worn into surgery.  Leave suitcase in the car. After surgery it may be brought to your room.  For patients admitted to the hospital, checkout time is 11:00 AM the day of discharge.   Patients discharged the day of surgery will not be allowed to drive home.  :     Please read over the following fact sheets that you were given: Coughing and Deep Breathing, Surgical Site Infection Prevention, Anesthesia Post-op Instructions and Care and Recovery After Surgery    Cataract A cataract is a clouding of the lens of the eye. When a lens becomes cloudy, vision is reduced based on the degree and nature of the clouding. Many cataracts reduce vision to some degree. Some cataracts make people more near-sighted as they develop. Other cataracts increase glare. Cataracts that are ignored and become worse can sometimes look white. The white color can be seen through the pupil. CAUSES   Aging. However, cataracts may occur at any age, even in newborns.   Certain drugs.   Trauma to the eye.   Certain diseases such as diabetes.   Specific eye diseases such as chronic inflammation inside the eye or a sudden attack of a rare form of glaucoma.   Inherited or acquired medical problems.  SYMPTOMS   Gradual, progressive drop in vision in the affected eye.   Severe, rapid visual loss. This most often happens when trauma is the cause.  DIAGNOSIS   To detect a cataract, an eye doctor examines the lens. Cataracts are best diagnosed with an exam of the eyes with the pupils enlarged (dilated) by drops.  TREATMENT  For an early cataract, vision may improve by using different eyeglasses or stronger lighting. If that does not help your vision, surgery is the only effective treatment. A cataract needs to be surgically removed when vision loss interferes with your everyday activities, such as driving, reading, or watching TV. A cataract may also have to be removed if it prevents examination or treatment of another eye problem. Surgery removes the cloudy lens and usually replaces it with a substitute lens (intraocular lens, IOL).  At a time when both you and your doctor agree, the cataract will be surgically removed. If you have cataracts in both eyes, only one is usually removed at a time. This allows the operated eye to heal and be out of danger from any possible problems after surgery (such as infection or poor wound healing). In rare cases, a cataract may be doing damage to your eye. In these cases, your caregiver may advise surgical removal right away. The vast majority of people who have cataract surgery have better vision afterward. HOME CARE INSTRUCTIONS  If you are not planning surgery, you may be asked to do the following:  Use different eyeglasses.   Use stronger or brighter lighting.   Ask your eye doctor about reducing  your medicine dose or changing medicines if it is thought that a medicine caused your cataract. Changing medicines does not make the cataract go away on its own.   Become familiar with your surroundings. Poor vision can lead to injury. Avoid bumping into things on the affected side. You are at a higher risk for tripping or falling.   Exercise extreme care when driving or operating machinery.   Wear sunglasses if you are sensitive to bright light or experiencing problems with glare.  SEEK IMMEDIATE MEDICAL CARE IF:   You  have a worsening or sudden vision loss.   You notice redness, swelling, or increasing pain in the eye.   You have a fever.  Document Released: 07/15/2005 Document Revised: 07/04/2011 Document Reviewed: 03/08/2011 [facility] Patient Information 2012 [place].PATIENT INSTRUCTIONS POST-ANESTHESIA  IMMEDIATELY FOLLOWING SURGERY:  Do not drive or operate machinery for the first twenty four hours after surgery.  Do not make any important decisions for twenty four hours after surgery or while taking narcotic pain medications or sedatives.  If you develop intractable nausea and vomiting or a severe headache please notify your doctor immediately.  FOLLOW-UP:  Please make an appointment with your surgeon as instructed. You do not need to follow up with anesthesia unless specifically instructed to do so.  WOUND CARE INSTRUCTIONS (if applicable):  Keep a dry clean dressing on the anesthesia/puncture wound site if there is drainage.  Once the wound has quit draining you may leave it open to air.  Generally you should leave the bandage intact for twenty four hours unless there is drainage.  If the epidural site drains for more than 36-48 hours please call the anesthesia department.  QUESTIONS?:  Please feel free to call your physician or the hospital operator if you have any questions, and they will be happy to assist you.

## 2013-09-29 ENCOUNTER — Other Ambulatory Visit: Payer: Self-pay

## 2013-09-29 ENCOUNTER — Encounter (HOSPITAL_COMMUNITY): Payer: Self-pay

## 2013-09-29 ENCOUNTER — Encounter (HOSPITAL_COMMUNITY)
Admission: RE | Admit: 2013-09-29 | Discharge: 2013-09-29 | Disposition: A | Discharge status: Home / Self Care | Payer: Medicare Other | Source: Ambulatory Visit | Attending: Ophthalmology | Admitting: Ophthalmology

## 2013-09-29 DIAGNOSIS — Z01812 Encounter for preprocedural laboratory examination: Secondary | ICD-10-CM | POA: Insufficient documentation

## 2013-09-29 DIAGNOSIS — Z01810 Encounter for preprocedural cardiovascular examination: Secondary | ICD-10-CM | POA: Insufficient documentation

## 2013-09-29 HISTORY — DX: Gastro-esophageal reflux disease without esophagitis: K21.9

## 2013-09-29 LAB — RAPID URINE DRUG SCREEN, HOSP PERFORMED
Amphetamines: NOT DETECTED
Barbiturates: NOT DETECTED
Benzodiazepines: POSITIVE — AB
Cocaine: NOT DETECTED
Opiates: NOT DETECTED
TETRAHYDROCANNABINOL: POSITIVE — AB

## 2013-09-29 LAB — SURGICAL PCR SCREEN
MRSA, PCR: NEGATIVE
Staphylococcus aureus: NEGATIVE

## 2013-09-29 NOTE — Progress Notes (Signed)
09/29/13 1125  OBSTRUCTIVE SLEEP APNEA  Have you ever been diagnosed with sleep apnea through a sleep study? No  Do you snore loudly (loud enough to be heard through closed doors)?  0  Do you often feel tired, fatigued, or sleepy during the daytime? 1  Has anyone observed you stop breathing during your sleep? 0  Do you have, or are you being treated for high blood pressure? 1  BMI more than 35 kg/m2? 1  Age over 40 years old? 0  Neck circumference greater than 40 cm/18 inches? 1  Gender: 1  Obstructive Sleep Apnea Score 5  Score 4 or greater  Results sent to PCP

## 2013-10-04 MED ORDER — PHENYLEPHRINE HCL 2.5 % OP SOLN
OPHTHALMIC | Status: AC
  Filled 2013-10-04: qty 15

## 2013-10-04 MED ORDER — TETRACAINE HCL 0.5 % OP SOLN
OPHTHALMIC | Status: AC
  Filled 2013-10-04: qty 2

## 2013-10-04 MED ORDER — KETOROLAC TROMETHAMINE 0.5 % OP SOLN
OPHTHALMIC | Status: AC
  Filled 2013-10-04: qty 5

## 2013-10-04 MED ORDER — CYCLOPENTOLATE-PHENYLEPHRINE OP SOLN OPTIME - NO CHARGE
OPHTHALMIC | Status: AC
  Filled 2013-10-04: qty 2

## 2013-10-05 ENCOUNTER — Ambulatory Visit (HOSPITAL_COMMUNITY)
Admission: RE | Admit: 2013-10-05 | Discharge: 2013-10-05 | Disposition: A | Discharge status: Home / Self Care | Payer: Medicare Other | Source: Ambulatory Visit | Attending: Ophthalmology | Admitting: Ophthalmology

## 2013-10-05 ENCOUNTER — Ambulatory Visit (HOSPITAL_COMMUNITY): Payer: Medicare Other | Admitting: Anesthesiology

## 2013-10-05 ENCOUNTER — Encounter (HOSPITAL_COMMUNITY)
Admission: RE | Disposition: A | Discharge status: Home / Self Care | Payer: Self-pay | Source: Ambulatory Visit | Attending: Ophthalmology

## 2013-10-05 ENCOUNTER — Encounter (HOSPITAL_COMMUNITY): Payer: Self-pay

## 2013-10-05 ENCOUNTER — Encounter (HOSPITAL_COMMUNITY): Payer: Medicare Other | Admitting: Anesthesiology

## 2013-10-05 DIAGNOSIS — Z79899 Other long term (current) drug therapy: Secondary | ICD-10-CM | POA: Insufficient documentation

## 2013-10-05 DIAGNOSIS — H2510 Age-related nuclear cataract, unspecified eye: Secondary | ICD-10-CM | POA: Insufficient documentation

## 2013-10-05 DIAGNOSIS — R569 Unspecified convulsions: Secondary | ICD-10-CM | POA: Insufficient documentation

## 2013-10-05 DIAGNOSIS — I10 Essential (primary) hypertension: Secondary | ICD-10-CM | POA: Insufficient documentation

## 2013-10-05 DIAGNOSIS — F17200 Nicotine dependence, unspecified, uncomplicated: Secondary | ICD-10-CM | POA: Insufficient documentation

## 2013-10-05 DIAGNOSIS — F329 Major depressive disorder, single episode, unspecified: Secondary | ICD-10-CM | POA: Insufficient documentation

## 2013-10-05 DIAGNOSIS — F3289 Other specified depressive episodes: Secondary | ICD-10-CM | POA: Insufficient documentation

## 2013-10-05 HISTORY — PX: CATARACT EXTRACTION W/PHACO: SHX586

## 2013-10-05 SURGERY — PHACOEMULSIFICATION, CATARACT, WITH IOL INSERTION
Anesthesia: Monitor Anesthesia Care | Site: Eye | Laterality: Right

## 2013-10-05 MED ORDER — TETRACAINE HCL 0.5 % OP SOLN
1.0000 [drp] | OPHTHALMIC | Status: AC
  Administered 2013-10-05 (×3): 1 [drp] via OPHTHALMIC

## 2013-10-05 MED ORDER — PROVISC 10 MG/ML IO SOLN
INTRAOCULAR | Status: DC | PRN
  Administered 2013-10-05: 0.85 mL via INTRAOCULAR

## 2013-10-05 MED ORDER — FENTANYL CITRATE 0.05 MG/ML IJ SOLN
INTRAMUSCULAR | Status: AC
  Filled 2013-10-05: qty 2

## 2013-10-05 MED ORDER — LACTATED RINGERS IV SOLN
INTRAVENOUS | Status: DC
  Administered 2013-10-05: 08:00:00 via INTRAVENOUS

## 2013-10-05 MED ORDER — MIDAZOLAM HCL 2 MG/2ML IJ SOLN
1.0000 mg | INTRAMUSCULAR | Status: DC | PRN
  Administered 2013-10-05: 2 mg via INTRAVENOUS

## 2013-10-05 MED ORDER — BSS IO SOLN
INTRAOCULAR | Status: DC | PRN
  Administered 2013-10-05: 15 mL via INTRAOCULAR

## 2013-10-05 MED ORDER — EPINEPHRINE HCL 1 MG/ML IJ SOLN
INTRAMUSCULAR | Status: AC
  Filled 2013-10-05: qty 1

## 2013-10-05 MED ORDER — EPINEPHRINE HCL 1 MG/ML IJ SOLN
INTRAOCULAR | Status: DC | PRN
  Administered 2013-10-05: 09:00:00

## 2013-10-05 MED ORDER — PHENYLEPHRINE HCL 2.5 % OP SOLN
1.0000 [drp] | OPHTHALMIC | Status: AC
  Administered 2013-10-05 (×3): 1 [drp] via OPHTHALMIC

## 2013-10-05 MED ORDER — MIDAZOLAM HCL 2 MG/2ML IJ SOLN
INTRAMUSCULAR | Status: AC
  Filled 2013-10-05: qty 2

## 2013-10-05 MED ORDER — KETOROLAC TROMETHAMINE 0.5 % OP SOLN
1.0000 [drp] | OPHTHALMIC | Status: DC
  Administered 2013-10-05 (×3): 1 [drp] via OPHTHALMIC

## 2013-10-05 MED ORDER — CYCLOPENTOLATE-PHENYLEPHRINE 0.2-1 % OP SOLN
1.0000 [drp] | OPHTHALMIC | Status: AC
  Administered 2013-10-05 (×3): 1 [drp] via OPHTHALMIC

## 2013-10-05 MED ORDER — TETRACAINE 0.5 % OP SOLN OPTIME - NO CHARGE
OPHTHALMIC | Status: DC | PRN
  Administered 2013-10-05: 2 [drp] via OPHTHALMIC

## 2013-10-05 SURGICAL SUPPLY — 24 items
CAPSULAR TENSION RING-AMO (OPHTHALMIC RELATED) IMPLANT
CLOTH BEACON ORANGE TIMEOUT ST (SAFETY) ×3 IMPLANT
EYE SHIELD UNIVERSAL CLEAR (GAUZE/BANDAGES/DRESSINGS) ×3 IMPLANT
GLOVE BIO SURGEON STRL SZ 6.5 (GLOVE) IMPLANT
GLOVE BIO SURGEONS STRL SZ 6.5 (GLOVE)
GLOVE ECLIPSE 6.5 STRL STRAW (GLOVE) IMPLANT
GLOVE ECLIPSE 7.0 STRL STRAW (GLOVE) IMPLANT
GLOVE EXAM NITRILE LRG STRL (GLOVE) IMPLANT
GLOVE EXAM NITRILE MD LF STRL (GLOVE) IMPLANT
GLOVE SKINSENSE NS SZ6.5 (GLOVE)
GLOVE SKINSENSE STRL SZ6.5 (GLOVE) IMPLANT
HEALON 5 0.6 ML (INTRAOCULAR LENS) IMPLANT
KIT VITRECTOMY (OPHTHALMIC RELATED) IMPLANT
PAD ARMBOARD 7.5X6 YLW CONV (MISCELLANEOUS) ×3 IMPLANT
PROC W NO LENS (INTRAOCULAR LENS)
PROC W SPEC LENS (INTRAOCULAR LENS)
PROCESS W NO LENS (INTRAOCULAR LENS) IMPLANT
PROCESS W SPEC LENS (INTRAOCULAR LENS) IMPLANT
RING MALYGIN (MISCELLANEOUS) IMPLANT
SIGHTPATH CAT PROC W REG LENS (Ophthalmic Related) ×3 IMPLANT
TAPE SURG TRANSPORE 1 IN (GAUZE/BANDAGES/DRESSINGS) ×1 IMPLANT
TAPE SURGICAL TRANSPORE 1 IN (GAUZE/BANDAGES/DRESSINGS) ×2
VISCOELASTIC ADDITIONAL (OPHTHALMIC RELATED) IMPLANT
WATER STERILE IRR 250ML POUR (IV SOLUTION) ×3 IMPLANT

## 2013-10-05 NOTE — Transfer of Care (Signed)
Immediate Anesthesia Transfer of Care Note  Patient: Philip Benton  Procedure(s) Performed: Procedure(s) (LRB): CATARACT EXTRACTION PHACO AND INTRAOCULAR LENS PLACEMENT RIGHT EYE (Right)  Patient Location: Shortstay  Anesthesia Type: MAC  Level of Consciousness: awake  Airway & Oxygen Therapy: Patient Spontanous Breathing   Post-op Assessment: Report given to PACU RN, Post -op Vital signs reviewed and stable and Patient moving all extremities  Post vital signs: Reviewed and stable  Complications: No apparent anesthesia complications

## 2013-10-05 NOTE — Discharge Instructions (Signed)
Philip Benton  10/05/2013           [facility] Instructions [address]- [place] [id number] [address]      1. Avoid closing eyes tightly. One often closes the eye tightly when laughing, talking, sneezing, coughing or if they feel irritated. At these times, you should be careful not to close your eyes tightly.  2. Instill eye drops as instructed. To instill drops in your eye, open it, look up and have someone gently pull the lower lid down and instill a couple of drops inside the lower lid.  3. Do not touch upper lid.  4. Take Advil or Tylenol for pain.  5. You may use either eye for near work, such as reading or sewing and you may watch television.  6. You may have your hair done at the beauty parlor at any time.  7. Wear dark glasses with or without your own glasses if you are in bright light.  8. Call our office at [phone] or [phone] if you have sharp pain in your eye or unusual symptoms.  9. Do not be concerned because vision in the operative eye is not good. It will not be good, no matter how successful the operation, until you get a special lens for it. Your old glasses will not be suited to the new eye that was operated on and you will not be ready for a new lens for about a month.  10. [facility].    I have received a copy of the above instructions and will follow them.   PATIENT INSTRUCTIONS POST-ANESTHESIA  IMMEDIATELY FOLLOWING SURGERY:  Do not drive or operate machinery for the first twenty four hours after surgery.  Do not make any important decisions for twenty four hours after surgery or while taking narcotic pain medications or sedatives.  If you develop intractable nausea and vomiting or a severe headache please notify your doctor immediately.  FOLLOW-UP:  Please make an appointment with your surgeon as instructed. You do not need to follow up with anesthesia unless specifically instructed to do  so.  WOUND CARE INSTRUCTIONS (if applicable):  Keep a dry clean dressing on the anesthesia/puncture wound site if there is drainage.  Once the wound has quit draining you may leave it open to air.  Generally you should leave the bandage intact for twenty four hours unless there is drainage.  If the epidural site drains for more than 36-48 hours please call the anesthesia department.  QUESTIONS?:  Please feel free to call your physician or the hospital operator if you have any questions, and they will be happy to assist you.

## 2013-10-05 NOTE — Op Note (Signed)
Patient brought to the operating room and prepped and draped in the usual manner.  Lid speculum inserted in right eye.  Stab incision made at the twelve o'clock position.  Provisc instilled in the anterior chamber.   A 2.4 mm. Stab incision was made temporally.  An anterior capsulotomy was done with a bent 25 gauge needle.  The nucleus was hydrodissected.  The Phaco tip was inserted in the anterior chamber and the nucleus was emulsified.  CDE was 0.49.  The cortical material was then removed with the I and A tip.  Posterior capsule was the polished.  The anterior chamber was deepened with Provisc.  A 11.0 Diopter Rayner 570C IOL was then inserted in the capsular bag.  Provisc was then removed with the I and A tip.  The wound was then hydrated.  Patient sent to the Recovery Room in good condition with follow up in my office.  Preoperative Diagnosis:  Nuclear Cataract OD Postoperative Diagnosis:  Same Procedure name: Kelman Phacoemulsification OD with IOL

## 2013-10-05 NOTE — H&P (Signed)
The patient was re examined and there is no change in the patients condition since the original H and P. 

## 2013-10-05 NOTE — Anesthesia Postprocedure Evaluation (Signed)
  Anesthesia Post-op Note  Patient: Philip Benton  Procedure(s) Performed: Procedure(s) (LRB): CATARACT EXTRACTION PHACO AND INTRAOCULAR LENS PLACEMENT RIGHT EYE (Right)  Patient Location:  Short Stay  Anesthesia Type: MAC  Level of Consciousness: awake  Airway and Oxygen Therapy: Patient Spontanous Breathing  Post-op Pain: none  Post-op Assessment: Post-op Vital signs reviewed, Patient's Cardiovascular Status Stable, Respiratory Function Stable, Patent Airway, No signs of Nausea or vomiting and Pain level controlled  Post-op Vital Signs: Reviewed and stable  Complications: No apparent anesthesia complications

## 2013-10-05 NOTE — Anesthesia Preprocedure Evaluation (Signed)
Anesthesia Evaluation  Patient identified by MRN, date of birth, ID band Patient awake    Reviewed: Allergy & Precautions, H&P , NPO status , Patient's Chart, lab work & pertinent test results  History of Anesthesia Complications Negative for: history of anesthetic complications  Airway Mallampati: II TM Distance: >3 FB     Dental  (+) Teeth Intact   Pulmonary Current Smoker,  breath sounds clear to auscultation        Cardiovascular hypertension, Pt. on medications Rhythm:Regular Rate:Normal     Neuro/Psych Seizures -, Well Controlled,  PSYCHIATRIC DISORDERS Depression Schizophrenia    GI/Hepatic GERD-  Medicated,  Endo/Other    Renal/GU Renal disease     Musculoskeletal   Abdominal   Peds  Hematology   Anesthesia Other Findings   Reproductive/Obstetrics                           Anesthesia Physical Anesthesia Plan  ASA: III  Anesthesia Plan: MAC   Post-op Pain Management:    Induction:   Airway Management Planned: Nasal Cannula  Additional Equipment:   Intra-op Plan:   Post-operative Plan:   Informed Consent: I have reviewed the patients History and Physical, chart, labs and discussed the procedure including the risks, benefits and alternatives for the proposed anesthesia with the patient or authorized representative who has indicated his/her understanding and acceptance.     Plan Discussed with:   Anesthesia Plan Comments:         Anesthesia Quick Evaluation  

## 2013-10-05 NOTE — Anesthesia Procedure Notes (Signed)
Procedure Name: MAC Date/Time: 10/05/2013 8:55 AM Performed by: Franco NonesYATES, Ransome Helwig S Pre-anesthesia Checklist: Patient identified, Emergency Drugs available, Suction available, Timeout performed and Patient being monitored Patient Re-evaluated:Patient Re-evaluated prior to inductionOxygen Delivery Method: Nasal Cannula

## 2013-10-06 ENCOUNTER — Encounter (HOSPITAL_COMMUNITY): Payer: Self-pay | Admitting: Ophthalmology

## 2013-10-27 ENCOUNTER — Encounter (HOSPITAL_COMMUNITY): Payer: Self-pay

## 2013-11-03 ENCOUNTER — Encounter (HOSPITAL_COMMUNITY)
Admission: RE | Admit: 2013-11-03 | Discharge: 2013-11-03 | Disposition: A | Discharge status: Home / Self Care | Payer: Medicare Other | Source: Ambulatory Visit | Attending: Ophthalmology | Admitting: Ophthalmology

## 2013-11-03 MED ORDER — ONDANSETRON HCL 4 MG/2ML IJ SOLN: 4.0000 mg | Freq: Once | INTRAMUSCULAR | Status: AC | PRN

## 2013-11-03 MED ORDER — FENTANYL CITRATE 0.05 MG/ML IJ SOLN: 25.0000 ug | INTRAMUSCULAR | Status: DC | PRN

## 2013-11-08 MED ORDER — PHENYLEPHRINE HCL 2.5 % OP SOLN
OPHTHALMIC | Status: AC
  Filled 2013-11-08: qty 15

## 2013-11-08 MED ORDER — TETRACAINE HCL 0.5 % OP SOLN
OPHTHALMIC | Status: AC
  Filled 2013-11-08: qty 2

## 2013-11-08 MED ORDER — CYCLOPENTOLATE-PHENYLEPHRINE OP SOLN OPTIME - NO CHARGE
OPHTHALMIC | Status: AC
  Filled 2013-11-08: qty 2

## 2013-11-08 MED ORDER — KETOROLAC TROMETHAMINE 0.5 % OP SOLN
OPHTHALMIC | Status: AC
  Filled 2013-11-08: qty 5

## 2013-11-09 ENCOUNTER — Ambulatory Visit (HOSPITAL_COMMUNITY)
Admission: RE | Admit: 2013-11-09 | Discharge: 2013-11-09 | Disposition: A | Discharge status: Home / Self Care | Payer: Medicare Other | Source: Ambulatory Visit | Attending: Ophthalmology | Admitting: Ophthalmology

## 2013-11-09 ENCOUNTER — Ambulatory Visit (HOSPITAL_COMMUNITY): Payer: Medicare Other | Admitting: Anesthesiology

## 2013-11-09 ENCOUNTER — Encounter (HOSPITAL_COMMUNITY): Payer: Self-pay | Admitting: *Deleted

## 2013-11-09 ENCOUNTER — Encounter (HOSPITAL_COMMUNITY)
Admission: RE | Disposition: A | Discharge status: Home / Self Care | Payer: Self-pay | Source: Ambulatory Visit | Attending: Ophthalmology

## 2013-11-09 ENCOUNTER — Encounter (HOSPITAL_COMMUNITY): Payer: Medicare Other | Admitting: Anesthesiology

## 2013-11-09 DIAGNOSIS — Z79899 Other long term (current) drug therapy: Secondary | ICD-10-CM | POA: Insufficient documentation

## 2013-11-09 DIAGNOSIS — H2510 Age-related nuclear cataract, unspecified eye: Secondary | ICD-10-CM | POA: Insufficient documentation

## 2013-11-09 DIAGNOSIS — I10 Essential (primary) hypertension: Secondary | ICD-10-CM | POA: Insufficient documentation

## 2013-11-09 DIAGNOSIS — F3289 Other specified depressive episodes: Secondary | ICD-10-CM | POA: Insufficient documentation

## 2013-11-09 DIAGNOSIS — F329 Major depressive disorder, single episode, unspecified: Secondary | ICD-10-CM | POA: Insufficient documentation

## 2013-11-09 DIAGNOSIS — K219 Gastro-esophageal reflux disease without esophagitis: Secondary | ICD-10-CM | POA: Insufficient documentation

## 2013-11-09 DIAGNOSIS — R569 Unspecified convulsions: Secondary | ICD-10-CM | POA: Insufficient documentation

## 2013-11-09 DIAGNOSIS — F17200 Nicotine dependence, unspecified, uncomplicated: Secondary | ICD-10-CM | POA: Insufficient documentation

## 2013-11-09 HISTORY — PX: CATARACT EXTRACTION W/PHACO: SHX586

## 2013-11-09 SURGERY — PHACOEMULSIFICATION, CATARACT, WITH IOL INSERTION
Anesthesia: Monitor Anesthesia Care | Site: Eye | Laterality: Left

## 2013-11-09 MED ORDER — TETRACAINE HCL 0.5 % OP SOLN
1.0000 [drp] | OPHTHALMIC | Status: AC
  Administered 2013-11-09 (×3): 1 [drp] via OPHTHALMIC

## 2013-11-09 MED ORDER — FENTANYL CITRATE 0.05 MG/ML IJ SOLN
25.0000 ug | INTRAMUSCULAR | Status: AC
  Administered 2013-11-09: 25 ug via INTRAVENOUS

## 2013-11-09 MED ORDER — CYCLOPENTOLATE-PHENYLEPHRINE 0.2-1 % OP SOLN
1.0000 [drp] | OPHTHALMIC | Status: AC
  Administered 2013-11-09 (×3): 1 [drp] via OPHTHALMIC

## 2013-11-09 MED ORDER — FENTANYL CITRATE 0.05 MG/ML IJ SOLN
INTRAMUSCULAR | Status: AC
  Filled 2013-11-09: qty 2

## 2013-11-09 MED ORDER — PROVISC 10 MG/ML IO SOLN
INTRAOCULAR | Status: DC | PRN
  Administered 2013-11-09: 0.85 mL via INTRAOCULAR

## 2013-11-09 MED ORDER — EPINEPHRINE HCL 1 MG/ML IJ SOLN
INTRAMUSCULAR | Status: AC
  Filled 2013-11-09: qty 1

## 2013-11-09 MED ORDER — MIDAZOLAM HCL 2 MG/2ML IJ SOLN
1.0000 mg | INTRAMUSCULAR | Status: DC | PRN
  Administered 2013-11-09 (×2): 1 mg via INTRAVENOUS

## 2013-11-09 MED ORDER — KETOROLAC TROMETHAMINE 0.5 % OP SOLN
1.0000 [drp] | OPHTHALMIC | Status: AC
  Administered 2013-11-09 (×3): 1 [drp] via OPHTHALMIC

## 2013-11-09 MED ORDER — BSS IO SOLN
INTRAOCULAR | Status: DC | PRN
  Administered 2013-11-09: 15 mL via INTRAOCULAR

## 2013-11-09 MED ORDER — PHENYLEPHRINE HCL 2.5 % OP SOLN
1.0000 [drp] | OPHTHALMIC | Status: AC
  Administered 2013-11-09 (×3): 1 [drp] via OPHTHALMIC

## 2013-11-09 MED ORDER — LACTATED RINGERS IV SOLN
INTRAVENOUS | Status: DC
  Administered 2013-11-09: 08:00:00 via INTRAVENOUS

## 2013-11-09 MED ORDER — TETRACAINE 0.5 % OP SOLN OPTIME - NO CHARGE
OPHTHALMIC | Status: DC | PRN
  Administered 2013-11-09: 2 [drp] via OPHTHALMIC

## 2013-11-09 MED ORDER — EPINEPHRINE HCL 1 MG/ML IJ SOLN
INTRAOCULAR | Status: DC | PRN
  Administered 2013-11-09: 09:00:00

## 2013-11-09 MED ORDER — MIDAZOLAM HCL 2 MG/2ML IJ SOLN
INTRAMUSCULAR | Status: AC
  Filled 2013-11-09: qty 2

## 2013-11-09 SURGICAL SUPPLY — 25 items
CAPSULAR TENSION RING-AMO (OPHTHALMIC RELATED) IMPLANT
CLOTH BEACON ORANGE TIMEOUT ST (SAFETY) ×3 IMPLANT
EYE SHIELD UNIVERSAL CLEAR (GAUZE/BANDAGES/DRESSINGS) ×3 IMPLANT
GLOVE BIO SURGEON STRL SZ 6.5 (GLOVE) ×2 IMPLANT
GLOVE BIO SURGEONS STRL SZ 6.5 (GLOVE) ×1
GLOVE ECLIPSE 6.5 STRL STRAW (GLOVE) IMPLANT
GLOVE ECLIPSE 7.0 STRL STRAW (GLOVE) IMPLANT
GLOVE EXAM NITRILE LRG STRL (GLOVE) IMPLANT
GLOVE EXAM NITRILE MD LF STRL (GLOVE) IMPLANT
GLOVE SKINSENSE NS SZ6.5 (GLOVE)
GLOVE SKINSENSE STRL SZ6.5 (GLOVE) IMPLANT
GLOVE SS N UNI LF 7.0 STRL (GLOVE) ×3 IMPLANT
HEALON 5 0.6 ML (INTRAOCULAR LENS) IMPLANT
KIT VITRECTOMY (OPHTHALMIC RELATED) IMPLANT
PAD ARMBOARD 7.5X6 YLW CONV (MISCELLANEOUS) ×3 IMPLANT
PROC W NO LENS (INTRAOCULAR LENS)
PROC W SPEC LENS (INTRAOCULAR LENS)
PROCESS W NO LENS (INTRAOCULAR LENS) IMPLANT
PROCESS W SPEC LENS (INTRAOCULAR LENS) IMPLANT
RING MALYGIN (MISCELLANEOUS) IMPLANT
SIGHTPATH CAT PROC W REG LENS (Ophthalmic Related) ×3 IMPLANT
TAPE SURG TRANSPORE 1 IN (GAUZE/BANDAGES/DRESSINGS) ×1 IMPLANT
TAPE SURGICAL TRANSPORE 1 IN (GAUZE/BANDAGES/DRESSINGS) ×2
VISCOELASTIC ADDITIONAL (OPHTHALMIC RELATED) IMPLANT
WATER STERILE IRR 250ML POUR (IV SOLUTION) ×3 IMPLANT

## 2013-11-09 NOTE — Transfer of Care (Signed)
Immediate Anesthesia Transfer of Care Note  Patient: Philip Benton  Procedure(s) Performed: Procedure(s) with comments: CATARACT EXTRACTION PHACO AND INTRAOCULAR LENS PLACEMENT (IOC) (Left) - CDE: 4.07  Patient Location: Short Stay  Anesthesia Type:MAC  Level of Consciousness: awake  Airway & Oxygen Therapy: Patient Spontanous Breathing  Post-op Assessment: Report given to PACU RN  Post vital signs: Reviewed  Complications: No apparent anesthesia complications

## 2013-11-09 NOTE — Anesthesia Postprocedure Evaluation (Signed)
  Anesthesia Post-op Note  Patient: Philip Benton  Procedure(s) Performed: Procedure(s) with comments: CATARACT EXTRACTION PHACO AND INTRAOCULAR LENS PLACEMENT (IOC) (Left) - CDE: 4.07  Patient Location: Short Stay  Anesthesia Type:MAC  Level of Consciousness: awake, alert  and oriented  Airway and Oxygen Therapy: Patient Spontanous Breathing  Post-op Pain: none  Post-op Assessment: Post-op Vital signs reviewed, Patient's Cardiovascular Status Stable, Respiratory Function Stable, Patent Airway and No signs of Nausea or vomiting  Post-op Vital Signs: Reviewed and stable  Last Vitals:  Filed Vitals:   11/09/13 0747  BP: 119/71  Pulse: 71  Temp: 36.5 C  Resp: 18    Complications: No apparent anesthesia complications

## 2013-11-09 NOTE — H&P (Signed)
The patient was re examined and there is no change in the patients condition since the original H and P. 

## 2013-11-09 NOTE — Anesthesia Preprocedure Evaluation (Signed)
Anesthesia Evaluation  Patient identified by MRN, date of birth, ID band Patient awake    Reviewed: Allergy & Precautions, H&P , NPO status , Patient's Chart, lab work & pertinent test results  History of Anesthesia Complications Negative for: history of anesthetic complications  Airway Mallampati: II TM Distance: >3 FB     Dental  (+) Teeth Intact   Pulmonary Current Smoker,  breath sounds clear to auscultation        Cardiovascular hypertension, Pt. on medications Rhythm:Regular Rate:Normal     Neuro/Psych Seizures -, Well Controlled,  PSYCHIATRIC DISORDERS Depression Schizophrenia    GI/Hepatic GERD-  Medicated,  Endo/Other    Renal/GU Renal disease     Musculoskeletal   Abdominal   Peds  Hematology   Anesthesia Other Findings   Reproductive/Obstetrics                           Anesthesia Physical Anesthesia Plan  ASA: III  Anesthesia Plan: MAC   Post-op Pain Management:    Induction:   Airway Management Planned: Nasal Cannula  Additional Equipment:   Intra-op Plan:   Post-operative Plan:   Informed Consent: I have reviewed the patients History and Physical, chart, labs and discussed the procedure including the risks, benefits and alternatives for the proposed anesthesia with the patient or authorized representative who has indicated his/her understanding and acceptance.     Plan Discussed with:   Anesthesia Plan Comments:         Anesthesia Quick Evaluation

## 2013-11-09 NOTE — Discharge Instructions (Signed)
Philip Benton  11/09/2013           [facility] Instructions [address]- [place] [id number] [address]      1. Avoid closing eyes tightly. One often closes the eye tightly when laughing, talking, sneezing, coughing or if they feel irritated. At these times, you should be careful not to close your eyes tightly.  2. Instill eye drops as instructed. To instill drops in your eye, open it, look up and have someone gently pull the lower lid down and instill a couple of drops inside the lower lid.  3. Do not touch upper lid.  4. Take Advil or Tylenol for pain.  5. You may use either eye for near work, such as reading or sewing and you may watch television.  6. You may have your hair done at the beauty parlor at any time.  7. Wear dark glasses with or without your own glasses if you are in bright light.  8. Call our office at [phone] or [phone] if you have sharp pain in your eye or unusual symptoms.  9. Do not be concerned because vision in the operative eye is not good. It will not be good, no matter how successful the operation, until you get a special lens for it. Your old glasses will not be suited to the new eye that was operated on and you will not be ready for a new lens for about a month.  10. [facility].    I have received a copy of the above instructions and will follow them.     Follow up with Dr. Nile RiggsShapiro today between 2-3 pm

## 2013-11-09 NOTE — Op Note (Signed)
Patient brought to the operating room and prepped and draped in the usual manner. Lid speculum inserted in left eye. Stab incision made at the twelve o'clock position. Provisc instilled in the anterior chamber. A 2.4 mm. Stab incision was made temporally. An anterior capsulotomy was done with a bent 25 gauge needle. The nucleus was hydrodissected. The Phaco tip was inserted in the anterior chamber and the nucleus was emulsified. CDE was 4.07. The cortical material was then removed with the I and A tip. Posterior capsule was the polished. The anterior chamber was deepened with Provisc. A 12.0 Diopter Rayner 570C IOL was then inserted in the capsular bag. Provisc was then removed with the I and A tip. The wound was then hydrated. Patient sent to the Recovery Room in good condition with follow up in my office.  Preoperative Diagnosis: Nuclear Cataract OS  Postoperative Diagnosis: Same  Procedure name: Kelman Phacoemulsification OS with IOL

## 2013-11-10 ENCOUNTER — Encounter (HOSPITAL_COMMUNITY): Payer: Self-pay | Admitting: Ophthalmology

## 2013-11-10 NOTE — Addendum Note (Signed)
Addendum created 11/10/13 1124 by Tanita Palinkas E Aarika Moon, CRNA   Modules edited: Anesthesia Flowsheet    

## 2014-06-13 DIAGNOSIS — F209 Schizophrenia, unspecified: Secondary | ICD-10-CM | POA: Insufficient documentation

## 2014-06-13 DIAGNOSIS — F329 Major depressive disorder, single episode, unspecified: Secondary | ICD-10-CM | POA: Insufficient documentation

## 2014-06-13 DIAGNOSIS — G40309 Generalized idiopathic epilepsy and epileptic syndromes, not intractable, without status epilepticus: Secondary | ICD-10-CM | POA: Insufficient documentation

## 2014-06-13 DIAGNOSIS — F32A Depression, unspecified: Secondary | ICD-10-CM | POA: Insufficient documentation

## 2014-06-13 DIAGNOSIS — Z720 Tobacco use: Secondary | ICD-10-CM | POA: Insufficient documentation

## 2014-06-13 DIAGNOSIS — E669 Obesity, unspecified: Secondary | ICD-10-CM | POA: Insufficient documentation

## 2014-06-13 DIAGNOSIS — I10 Essential (primary) hypertension: Secondary | ICD-10-CM | POA: Insufficient documentation

## 2014-10-15 ENCOUNTER — Emergency Department (HOSPITAL_COMMUNITY)
Admission: EM | Admit: 2014-10-15 | Discharge: 2014-10-16 | Disposition: A | Discharge status: Home / Self Care | Payer: Medicare Other | Attending: Emergency Medicine | Admitting: Emergency Medicine

## 2014-10-15 ENCOUNTER — Encounter (HOSPITAL_COMMUNITY): Payer: Self-pay

## 2014-10-15 DIAGNOSIS — Z87442 Personal history of urinary calculi: Secondary | ICD-10-CM | POA: Insufficient documentation

## 2014-10-15 DIAGNOSIS — X58XXXA Exposure to other specified factors, initial encounter: Secondary | ICD-10-CM | POA: Diagnosis not present

## 2014-10-15 DIAGNOSIS — Z880 Allergy status to penicillin: Secondary | ICD-10-CM | POA: Insufficient documentation

## 2014-10-15 DIAGNOSIS — R0602 Shortness of breath: Secondary | ICD-10-CM | POA: Insufficient documentation

## 2014-10-15 DIAGNOSIS — Y9389 Activity, other specified: Secondary | ICD-10-CM | POA: Diagnosis not present

## 2014-10-15 DIAGNOSIS — Z8739 Personal history of other diseases of the musculoskeletal system and connective tissue: Secondary | ICD-10-CM | POA: Diagnosis not present

## 2014-10-15 DIAGNOSIS — S199XXA Unspecified injury of neck, initial encounter: Secondary | ICD-10-CM | POA: Diagnosis not present

## 2014-10-15 DIAGNOSIS — S39012A Strain of muscle, fascia and tendon of lower back, initial encounter: Secondary | ICD-10-CM | POA: Insufficient documentation

## 2014-10-15 DIAGNOSIS — K219 Gastro-esophageal reflux disease without esophagitis: Secondary | ICD-10-CM | POA: Insufficient documentation

## 2014-10-15 DIAGNOSIS — I10 Essential (primary) hypertension: Secondary | ICD-10-CM | POA: Diagnosis not present

## 2014-10-15 DIAGNOSIS — Y9289 Other specified places as the place of occurrence of the external cause: Secondary | ICD-10-CM | POA: Diagnosis not present

## 2014-10-15 DIAGNOSIS — Z720 Tobacco use: Secondary | ICD-10-CM | POA: Insufficient documentation

## 2014-10-15 DIAGNOSIS — Y998 Other external cause status: Secondary | ICD-10-CM | POA: Insufficient documentation

## 2014-10-15 DIAGNOSIS — G8929 Other chronic pain: Secondary | ICD-10-CM | POA: Diagnosis not present

## 2014-10-15 DIAGNOSIS — Z79899 Other long term (current) drug therapy: Secondary | ICD-10-CM | POA: Insufficient documentation

## 2014-10-15 DIAGNOSIS — M542 Cervicalgia: Secondary | ICD-10-CM

## 2014-10-15 DIAGNOSIS — S3992XA Unspecified injury of lower back, initial encounter: Secondary | ICD-10-CM | POA: Diagnosis present

## 2014-10-15 DIAGNOSIS — F329 Major depressive disorder, single episode, unspecified: Secondary | ICD-10-CM | POA: Diagnosis not present

## 2014-10-15 NOTE — ED Notes (Signed)
Pt was moving furniture today, while lifting couch began having mid lower back pain with radiation to the neck .

## 2014-10-16 DIAGNOSIS — S39012A Strain of muscle, fascia and tendon of lower back, initial encounter: Secondary | ICD-10-CM | POA: Diagnosis not present

## 2014-10-16 MED ORDER — HYDROMORPHONE HCL 2 MG/ML IJ SOLN
2.0000 mg | Freq: Once | INTRAMUSCULAR | Status: AC
  Administered 2014-10-16: 2 mg via INTRAMUSCULAR
  Filled 2014-10-16: qty 1

## 2014-10-16 MED ORDER — OXYCODONE-ACETAMINOPHEN 5-325 MG PO TABS: 1.0000 | ORAL_TABLET | Freq: Four times a day (QID) | ORAL | Status: DC | PRN

## 2014-10-16 MED ORDER — ONDANSETRON 4 MG PO TBDP
4.0000 mg | ORAL_TABLET | Freq: Once | ORAL | Status: AC
  Administered 2014-10-16: 4 mg via ORAL
  Filled 2014-10-16: qty 1

## 2014-10-16 MED ORDER — NAPROXEN 500 MG PO TABS: 500.0000 mg | ORAL_TABLET | Freq: Two times a day (BID) | ORAL | Status: DC
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
## 2014-10-16 NOTE — ED Notes (Signed)
Pt alert & oriented x4, stable gait. Patient given discharge instructions, paperwork & prescription(s). Patient informed not to drive, operate any equipment & handel any important documents 4 hours after taking pain medication. Patient  instructed to stop at the registration desk to finish any additional paperwork. Patient  verbalized understanding. Pt left department w/ no further questions. 

## 2014-10-16 NOTE — Discharge Instructions (Signed)
Take the Naprosyn as directed. That is same as the Aleve that you're already taking. Take the Percocet as needed. Make an appointment to follow-up with your regular doctor on Monday. Based on the nerve symptoms you have any left hand MRI would be appropriate.

## 2014-12-14 DIAGNOSIS — M5442 Lumbago with sciatica, left side: Secondary | ICD-10-CM

## 2014-12-14 DIAGNOSIS — G8929 Other chronic pain: Secondary | ICD-10-CM | POA: Insufficient documentation

## 2016-01-07 ENCOUNTER — Emergency Department (HOSPITAL_COMMUNITY): Payer: Medicare Other

## 2016-01-07 ENCOUNTER — Emergency Department (HOSPITAL_COMMUNITY)
Admission: EM | Admit: 2016-01-07 | Discharge: 2016-01-07 | Disposition: A | Discharge status: Home / Self Care | Payer: Medicare Other | Attending: Emergency Medicine | Admitting: Emergency Medicine

## 2016-01-07 ENCOUNTER — Encounter (HOSPITAL_COMMUNITY): Payer: Self-pay | Admitting: Emergency Medicine

## 2016-01-07 DIAGNOSIS — S0083XA Contusion of other part of head, initial encounter: Secondary | ICD-10-CM

## 2016-01-07 DIAGNOSIS — W109XXA Fall (on) (from) unspecified stairs and steps, initial encounter: Secondary | ICD-10-CM | POA: Diagnosis not present

## 2016-01-07 DIAGNOSIS — R51 Headache: Secondary | ICD-10-CM | POA: Diagnosis not present

## 2016-01-07 DIAGNOSIS — M1990 Unspecified osteoarthritis, unspecified site: Secondary | ICD-10-CM | POA: Insufficient documentation

## 2016-01-07 DIAGNOSIS — M542 Cervicalgia: Secondary | ICD-10-CM | POA: Diagnosis not present

## 2016-01-07 DIAGNOSIS — I10 Essential (primary) hypertension: Secondary | ICD-10-CM | POA: Insufficient documentation

## 2016-01-07 DIAGNOSIS — S0081XA Abrasion of other part of head, initial encounter: Secondary | ICD-10-CM

## 2016-01-07 DIAGNOSIS — F209 Schizophrenia, unspecified: Secondary | ICD-10-CM | POA: Diagnosis not present

## 2016-01-07 DIAGNOSIS — Y939 Activity, unspecified: Secondary | ICD-10-CM | POA: Diagnosis not present

## 2016-01-07 DIAGNOSIS — R519 Headache, unspecified: Secondary | ICD-10-CM

## 2016-01-07 DIAGNOSIS — Y92009 Unspecified place in unspecified non-institutional (private) residence as the place of occurrence of the external cause: Secondary | ICD-10-CM | POA: Diagnosis not present

## 2016-01-07 DIAGNOSIS — S0990XA Unspecified injury of head, initial encounter: Secondary | ICD-10-CM | POA: Diagnosis present

## 2016-01-07 DIAGNOSIS — Y999 Unspecified external cause status: Secondary | ICD-10-CM | POA: Insufficient documentation

## 2016-01-07 DIAGNOSIS — F17210 Nicotine dependence, cigarettes, uncomplicated: Secondary | ICD-10-CM | POA: Diagnosis not present

## 2016-01-07 DIAGNOSIS — Z79899 Other long term (current) drug therapy: Secondary | ICD-10-CM | POA: Insufficient documentation

## 2016-01-07 DIAGNOSIS — W19XXXA Unspecified fall, initial encounter: Secondary | ICD-10-CM

## 2016-01-07 MED ORDER — TETANUS-DIPHTH-ACELL PERTUSSIS 5-2.5-18.5 LF-MCG/0.5 IM SUSP
0.5000 mL | Freq: Once | INTRAMUSCULAR | Status: AC
  Administered 2016-01-07: 0.5 mL via INTRAMUSCULAR
  Filled 2016-01-07: qty 0.5
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
## 2016-01-07 NOTE — ED Notes (Signed)
PT states he fell down 3 steps in the back of his house and hit the left side of his head on rock yesterday evening. Abrasions and bruising noted to left side of head and pt states he may have had LOC. PT denies any visual changes and is ambulatory in triage and A&O x4.

## 2016-01-07 NOTE — Discharge Instructions (Signed)
Abrasion An abrasion is a cut or scrape on the outer surface of your skin. An abrasion does not extend through all of the layers of your skin. It is important to care for your abrasion properly to prevent infection. CAUSES Most abrasions are caused by falling on or gliding across the ground or another surface. When your skin rubs on something, the outer and inner layer of skin rubs off.  SYMPTOMS A cut or scrape is the main symptom of this condition. The scrape may be bleeding, or it may appear red or pink. If there was an associated fall, there may be an underlying bruise. DIAGNOSIS An abrasion is diagnosed with a physical exam. TREATMENT Treatment for this condition depends on how large and deep the abrasion is. Usually, your abrasion will be cleaned with water and mild soap. This removes any dirt or debris that may be stuck. An antibiotic ointment may be applied to the abrasion to help prevent infection. A bandage (dressing) may be placed on the abrasion to keep it clean. You may also need a tetanus shot. HOME CARE INSTRUCTIONS Medicines  Take or apply medicines only as directed by your health care provider.  If you were prescribed an antibiotic ointment, finish all of it even if you start to feel better. Wound Care  Clean the wound with mild soap and water 2-3 times per day or as directed by your health care provider. Pat your wound dry with a clean towel. Do not rub it.  There are many different ways to close and cover a wound. Follow instructions from your health care provider about:  Wound care.  Dressing changes and removal.  Check your wound every day for signs of infection. Watch for:  Redness, swelling, or pain.  Fluid, blood, or pus. General Instructions  Keep the dressing dry as directed by your health care provider. Do not take baths, swim, use a hot tub, or do anything that would put your wound underwater until your health care provider approves.  If there is  swelling, raise (elevate) the injured area above the level of your heart while you are sitting or lying down.  Keep all follow-up visits as directed by your health care provider. This is important. SEEK MEDICAL CARE IF:  You received a tetanus shot and you have swelling, severe pain, redness, or bleeding at the injection site.  Your pain is not controlled with medicine.  You have increased redness, swelling, or pain at the site of your wound. SEEK IMMEDIATE MEDICAL CARE IF:  You have a red streak going away from your wound.  You have a fever.  You have fluid, blood, or pus coming from your wound.  You notice a bad smell coming from your wound or your dressing.   This information is not intended to replace advice given to you by your health care provider. Make sure you discuss any questions you have with your health care provider.   Document Released: 04/24/2005 Document Revised: 04/05/2015 Document Reviewed: 07/13/2014 Elsevier Interactive Patient Education 2016 [employer]  Cervical Strain and Sprain With Rehab Cervical strain and sprain are injuries that commonly occur with "whiplash" injuries. Whiplash occurs when the neck is forcefully whipped backward or forward, such as during a motor vehicle accident or during contact sports. The muscles, ligaments, tendons, discs, and nerves of the neck are susceptible to injury when this occurs. RISK FACTORS Risk of having a whiplash injury increases if:  Osteoarthritis of the spine.  Situations that make head  or neck accidents or trauma more likely.  High-risk sports (football, rugby, wrestling, hockey, auto racing, gymnastics, diving, contact karate, or boxing).  Poor strength and flexibility of the neck.  Previous neck injury.  Poor tackling technique.  Improperly fitted or padded equipment. SYMPTOMS   Pain or stiffness in the front or back of neck or both.  Symptoms may present immediately or up to 24 hours after  injury.  Dizziness, headache, nausea, and vomiting.  Muscle spasm with soreness and stiffness in the neck.  Tenderness and swelling at the injury site. PREVENTION  Learn and use proper technique (avoid tackling with the head, spearing, and head-butting; use proper falling techniques to avoid landing on the head).  Warm up and stretch properly before activity.  Maintain physical fitness:  Strength, flexibility, and endurance.  Cardiovascular fitness.  Wear properly fitted and padded protective equipment, such as padded soft collars, for participation in contact sports. PROGNOSIS  Recovery from cervical strain and sprain injuries is dependent on the extent of the injury. These injuries are usually curable in 1 week to 3 months with appropriate treatment.  RELATED COMPLICATIONS   Temporary numbness and weakness may occur if the nerve roots are damaged, and this may persist until the nerve has completely healed.  Chronic pain due to frequent recurrence of symptoms.  Prolonged healing, especially if activity is resumed too soon (before complete recovery). TREATMENT  Treatment initially involves the use of ice and medication to help reduce pain and inflammation. It is also important to perform strengthening and stretching exercises and modify activities that worsen symptoms so the injury does not get worse. These exercises may be performed at home or with a therapist. For patients who experience severe symptoms, a soft, padded collar may be recommended to be worn around the neck.  Improving your posture may help reduce symptoms. Posture improvement includes pulling your chin and abdomen in while sitting or standing. If you are sitting, sit in a firm chair with your buttocks against the back of the chair. While sleeping, try replacing your pillow with a small towel rolled to 2 inches in diameter, or use a cervical pillow or soft cervical collar. Poor sleeping positions delay healing.  For  patients with nerve root damage, which causes numbness or weakness, the use of a cervical traction apparatus may be recommended. Surgery is rarely necessary for these injuries. However, cervical strain and sprains that are present at birth (congenital) may require surgery. MEDICATION   If pain medication is necessary, nonsteroidal anti-inflammatory medications, such as aspirin and ibuprofen, or other minor pain relievers, such as acetaminophen, are often recommended.  Do not take pain medication for 7 days before surgery.  Prescription pain relievers may be given if deemed necessary by your caregiver. Use only as directed and only as much as you need. HEAT AND COLD:   Cold treatment (icing) relieves pain and reduces inflammation. Cold treatment should be applied for 10 to 15 minutes every 2 to 3 hours for inflammation and pain and immediately after any activity that aggravates your symptoms. Use ice packs or an ice massage.  Heat treatment may be used prior to performing the stretching and strengthening activities prescribed by your caregiver, physical therapist, or athletic trainer. Use a heat pack or a warm soak. SEEK MEDICAL CARE IF:   Symptoms get worse or do not improve in 2 weeks despite treatment.  New, unexplained symptoms develop (drugs used in treatment may produce side effects). EXERCISES RANGE OF MOTION (ROM) AND  STRETCHING EXERCISES - Cervical Strain and Sprain These exercises may help you when beginning to rehabilitate your injury. In order to successfully resolve your symptoms, you must improve your posture. These exercises are designed to help reduce the forward-head and rounded-shoulder posture which contributes to this condition. Your symptoms may resolve with or without further involvement from your physician, physical therapist or athletic trainer. While completing these exercises, remember:   Restoring tissue flexibility helps normal motion to return to the joints. This  allows healthier, less painful movement and activity.  An effective stretch should be held for at least 20 seconds, although you may need to begin with shorter hold times for comfort.  A stretch should never be painful. You should only feel a gentle lengthening or release in the stretched tissue. STRETCH- Axial Extensors  Lie on your back on the floor. You may bend your knees for comfort. Place a rolled-up hand towel or dish towel, about 2 inches in diameter, under the part of your head that makes contact with the floor.  Gently tuck your chin, as if trying to make a "double chin," until you feel a gentle stretch at the base of your head.  Hold __________ seconds. Repeat __________ times. Complete this exercise __________ times per day.  STRETCH - Axial Extension   Stand or sit on a firm surface. Assume a good posture: chest up, shoulders drawn back, abdominal muscles slightly tense, knees unlocked (if standing) and feet hip width apart.  Slowly retract your chin so your head slides back and your chin slightly lowers. Continue to look straight ahead.  You should feel a gentle stretch in the back of your head. Be certain not to feel an aggressive stretch since this can cause headaches later.  Hold for __________ seconds. Repeat __________ times. Complete this exercise __________ times per day. STRETCH - Cervical Side Bend   Stand or sit on a firm surface. Assume a good posture: chest up, shoulders drawn back, abdominal muscles slightly tense, knees unlocked (if standing) and feet hip width apart.  Without letting your nose or shoulders move, slowly tip your right / left ear to your shoulder until your feel a gentle stretch in the muscles on the opposite side of your neck.  Hold __________ seconds. Repeat __________ times. Complete this exercise __________ times per day. STRETCH - Cervical Rotators   Stand or sit on a firm surface. Assume a good posture: chest up, shoulders drawn back,  abdominal muscles slightly tense, knees unlocked (if standing) and feet hip width apart.  Keeping your eyes level with the ground, slowly turn your head until you feel a gentle stretch along the back and opposite side of your neck.  Hold __________ seconds. Repeat __________ times. Complete this exercise __________ times per day. RANGE OF MOTION - Neck Circles   Stand or sit on a firm surface. Assume a good posture: chest up, shoulders drawn back, abdominal muscles slightly tense, knees unlocked (if standing) and feet hip width apart.  Gently roll your head down and around from the back of one shoulder to the back of the other. The motion should never be forced or painful.  Repeat the motion 10-20 times, or until you feel the neck muscles relax and loosen. Repeat __________ times. Complete the exercise __________ times per day. STRENGTHENING EXERCISES - Cervical Strain and Sprain These exercises may help you when beginning to rehabilitate your injury. They may resolve your symptoms with or without further involvement from your physician, physical therapist,  or Event organiser. While completing these exercises, remember:   Muscles can gain both the endurance and the strength needed for everyday activities through controlled exercises.  Complete these exercises as instructed by your physician, physical therapist, or athletic trainer. Progress the resistance and repetitions only as guided.  You may experience muscle soreness or fatigue, but the pain or discomfort you are trying to eliminate should never worsen during these exercises. If this pain does worsen, stop and make certain you are following the directions exactly. If the pain is still present after adjustments, discontinue the exercise until you can discuss the trouble with your clinician. STRENGTH - Cervical Flexors, Isometric  Face a wall, standing about 6 inches away. Place a small pillow, a ball about 6-8 inches in diameter, or a  folded towel between your forehead and the wall.  Slightly tuck your chin and gently push your forehead into the soft object. Push only with mild to moderate intensity, building up tension gradually. Keep your jaw and forehead relaxed.  Hold 10 to 20 seconds. Keep your breathing relaxed.  Release the tension slowly. Relax your neck muscles completely before you start the next repetition. Repeat __________ times. Complete this exercise __________ times per day. STRENGTH- Cervical Lateral Flexors, Isometric   Stand about 6 inches away from a wall. Place a small pillow, a ball about 6-8 inches in diameter, or a folded towel between the side of your head and the wall.  Slightly tuck your chin and gently tilt your head into the soft object. Push only with mild to moderate intensity, building up tension gradually. Keep your jaw and forehead relaxed.  Hold 10 to 20 seconds. Keep your breathing relaxed.  Release the tension slowly. Relax your neck muscles completely before you start the next repetition. Repeat __________ times. Complete this exercise __________ times per day. STRENGTH - Cervical Extensors, Isometric   Stand about 6 inches away from a wall. Place a small pillow, a ball about 6-8 inches in diameter, or a folded towel between the back of your head and the wall.  Slightly tuck your chin and gently tilt your head back into the soft object. Push only with mild to moderate intensity, building up tension gradually. Keep your jaw and forehead relaxed.  Hold 10 to 20 seconds. Keep your breathing relaxed.  Release the tension slowly. Relax your neck muscles completely before you start the next repetition. Repeat __________ times. Complete this exercise __________ times per day. POSTURE AND BODY MECHANICS CONSIDERATIONS - Cervical Strain and Sprain Keeping correct posture when sitting, standing or completing your activities will reduce the stress put on different body tissues, allowing  injured tissues a chance to heal and limiting painful experiences. The following are general guidelines for improved posture. Your physician or physical therapist will provide you with any instructions specific to your needs. While reading these guidelines, remember:  The exercises prescribed by your provider will help you have the flexibility and strength to maintain correct postures.  The correct posture provides the optimal environment for your joints to work. All of your joints have less wear and tear when properly supported by a spine with good posture. This means you will experience a healthier, less painful body.  Correct posture must be practiced with all of your activities, especially prolonged sitting and standing. Correct posture is as important when doing repetitive low-stress activities (typing) as it is when doing a single heavy-load activity (lifting). PROLONGED STANDING WHILE SLIGHTLY LEANING FORWARD When completing a task that  requires you to lean forward while standing in one place for a long time, place either foot up on a stationary 2- to 4-inch high object to help maintain the best posture. When both feet are on the ground, the low back tends to lose its slight inward curve. If this curve flattens (or becomes too large), then the back and your other joints will experience too much stress, fatigue more quickly, and can cause pain.  RESTING POSITIONS Consider which positions are most painful for you when choosing a resting position. If you have pain with flexion-based activities (sitting, bending, stooping, squatting), choose a position that allows you to rest in a less flexed posture. You would want to avoid curling into a fetal position on your side. If your pain worsens with extension-based activities (prolonged standing, working overhead), avoid resting in an extended position such as sleeping on your stomach. Most people will find more comfort when they rest with their spine in a  more neutral position, neither too rounded nor too arched. Lying on a non-sagging bed on your side with a pillow between your knees, or on your back with a pillow under your knees will often provide some relief. Keep in mind, being in any one position for a prolonged period of time, no matter how correct your posture, can still lead to stiffness. WALKING Walk with an upright posture. Your ears, shoulders, and hips should all line up. OFFICE WORK When working at a desk, create an environment that supports good, upright posture. Without extra support, muscles fatigue and lead to excessive strain on joints and other tissues. CHAIR:  A chair should be able to slide under your desk when your back makes contact with the back of the chair. This allows you to work closely.  The chair's height should allow your eyes to be level with the upper part of your monitor and your hands to be slightly lower than your elbows.  Body position:  Your feet should make contact with the floor. If this is not possible, use a foot rest.  Keep your ears over your shoulders. This will reduce stress on your neck and low back.   This information is not intended to replace advice given to you by your health care provider. Make sure you discuss any questions you have with your health care provider.   Document Released: 07/15/2005 Document Revised: 08/05/2014 Document Reviewed: 10/27/2008 Elsevier Interactive Patient Education [employer]2016 [employer] Inc[employer]

## 2016-06-11 ENCOUNTER — Other Ambulatory Visit: Payer: Self-pay | Admitting: Internal Medicine

## 2016-06-11 DIAGNOSIS — R1011 Right upper quadrant pain: Secondary | ICD-10-CM

## 2016-06-25 ENCOUNTER — Ambulatory Visit: Admission: RE | Admit: 2016-06-25 | Payer: Medicare Other | Source: Ambulatory Visit

## 2016-07-04 ENCOUNTER — Ambulatory Visit: Payer: Medicare Other

## 2016-07-10 ENCOUNTER — Ambulatory Visit
Admission: RE | Admit: 2016-07-10 | Discharge: 2016-07-10 | Disposition: A | Discharge status: Home / Self Care | Payer: Medicare Other | Source: Ambulatory Visit | Attending: Internal Medicine | Admitting: Internal Medicine

## 2016-07-10 DIAGNOSIS — K760 Fatty (change of) liver, not elsewhere classified: Secondary | ICD-10-CM | POA: Diagnosis not present

## 2016-07-10 DIAGNOSIS — R1011 Right upper quadrant pain: Secondary | ICD-10-CM | POA: Diagnosis not present

## 2016-08-19 ENCOUNTER — Other Ambulatory Visit: Payer: Self-pay | Admitting: Nurse Practitioner

## 2016-08-19 DIAGNOSIS — K760 Fatty (change of) liver, not elsewhere classified: Secondary | ICD-10-CM | POA: Diagnosis not present

## 2016-08-19 DIAGNOSIS — Z6841 Body Mass Index (BMI) 40.0 and over, adult: Secondary | ICD-10-CM | POA: Diagnosis not present

## 2016-08-19 DIAGNOSIS — R1011 Right upper quadrant pain: Secondary | ICD-10-CM | POA: Insufficient documentation

## 2016-08-19 DIAGNOSIS — K219 Gastro-esophageal reflux disease without esophagitis: Secondary | ICD-10-CM | POA: Insufficient documentation

## 2016-08-19 DIAGNOSIS — Z800 Family history of malignant neoplasm of digestive organs: Secondary | ICD-10-CM | POA: Insufficient documentation

## 2016-08-19 DIAGNOSIS — R0602 Shortness of breath: Secondary | ICD-10-CM | POA: Diagnosis not present

## 2016-08-21 DIAGNOSIS — B1710 Acute hepatitis C without hepatic coma: Secondary | ICD-10-CM | POA: Diagnosis not present

## 2016-08-22 DIAGNOSIS — R0602 Shortness of breath: Secondary | ICD-10-CM | POA: Insufficient documentation

## 2016-08-29 ENCOUNTER — Ambulatory Visit
Admission: RE | Admit: 2016-08-29 | Discharge: 2016-08-29 | Disposition: A | Discharge status: Home / Self Care | Payer: Medicare Other | Source: Ambulatory Visit | Attending: Nurse Practitioner | Admitting: Nurse Practitioner

## 2016-08-29 DIAGNOSIS — R1011 Right upper quadrant pain: Secondary | ICD-10-CM | POA: Diagnosis not present

## 2016-08-29 MED ORDER — GADOBENATE DIMEGLUMINE 529 MG/ML IV SOLN
20.0000 mL | Freq: Once | INTRAVENOUS | Status: AC | PRN
  Administered 2016-08-29: 20 mL via INTRAVENOUS

## 2016-09-03 DIAGNOSIS — I10 Essential (primary) hypertension: Secondary | ICD-10-CM | POA: Diagnosis not present

## 2016-09-03 DIAGNOSIS — R0789 Other chest pain: Secondary | ICD-10-CM | POA: Diagnosis not present

## 2016-09-03 DIAGNOSIS — Z720 Tobacco use: Secondary | ICD-10-CM | POA: Diagnosis not present

## 2016-09-03 DIAGNOSIS — R0602 Shortness of breath: Secondary | ICD-10-CM | POA: Diagnosis not present

## 2016-09-03 DIAGNOSIS — R079 Chest pain, unspecified: Secondary | ICD-10-CM | POA: Insufficient documentation

## 2016-09-18 DIAGNOSIS — F3181 Bipolar II disorder: Secondary | ICD-10-CM | POA: Diagnosis not present

## 2016-09-18 DIAGNOSIS — Z79899 Other long term (current) drug therapy: Secondary | ICD-10-CM | POA: Diagnosis not present

## 2016-09-20 DIAGNOSIS — Z114 Encounter for screening for human immunodeficiency virus [HIV]: Secondary | ICD-10-CM | POA: Diagnosis not present

## 2016-09-20 DIAGNOSIS — B182 Chronic viral hepatitis C: Secondary | ICD-10-CM | POA: Diagnosis not present

## 2016-09-24 ENCOUNTER — Emergency Department (HOSPITAL_COMMUNITY)
Admission: EM | Admit: 2016-09-24 | Discharge: 2016-09-24 | Disposition: A | Discharge status: Home / Self Care | Payer: Medicare Other | Attending: Emergency Medicine | Admitting: Emergency Medicine

## 2016-09-24 ENCOUNTER — Emergency Department (HOSPITAL_COMMUNITY): Payer: Medicare Other

## 2016-09-24 ENCOUNTER — Encounter (HOSPITAL_COMMUNITY): Payer: Self-pay | Admitting: Emergency Medicine

## 2016-09-24 DIAGNOSIS — Z79899 Other long term (current) drug therapy: Secondary | ICD-10-CM | POA: Diagnosis not present

## 2016-09-24 DIAGNOSIS — F17210 Nicotine dependence, cigarettes, uncomplicated: Secondary | ICD-10-CM | POA: Insufficient documentation

## 2016-09-24 DIAGNOSIS — I10 Essential (primary) hypertension: Secondary | ICD-10-CM | POA: Insufficient documentation

## 2016-09-24 DIAGNOSIS — R52 Pain, unspecified: Secondary | ICD-10-CM

## 2016-09-24 DIAGNOSIS — R109 Unspecified abdominal pain: Secondary | ICD-10-CM | POA: Diagnosis not present

## 2016-09-24 DIAGNOSIS — N200 Calculus of kidney: Secondary | ICD-10-CM | POA: Diagnosis not present

## 2016-09-24 LAB — URINALYSIS, ROUTINE W REFLEX MICROSCOPIC
Bilirubin Urine: NEGATIVE
Glucose, UA: NEGATIVE mg/dL
Hgb urine dipstick: NEGATIVE
Ketones, ur: NEGATIVE mg/dL
LEUKOCYTES UA: NEGATIVE
Nitrite: NEGATIVE
Protein, ur: NEGATIVE mg/dL
Specific Gravity, Urine: 1.013 (ref 1.005–1.030)
pH: 6 (ref 5.0–8.0)

## 2016-09-24 LAB — BASIC METABOLIC PANEL
ANION GAP: 10 (ref 5–15)
BUN: 12 mg/dL (ref 6–20)
CO2: 24 mmol/L (ref 22–32)
Calcium: 9.4 mg/dL (ref 8.9–10.3)
Chloride: 103 mmol/L (ref 101–111)
Creatinine, Ser: 0.93 mg/dL (ref 0.61–1.24)
GFR calc Af Amer: >60 mL/min (ref >60)
GFR calc non Af Amer: >60 mL/min (ref >60)
Glucose, Bld: 103 mg/dL — ABNORMAL HIGH (ref 65–99)
POTASSIUM: 4.3 mmol/L (ref 3.5–5.1)
SODIUM: 137 mmol/L (ref 135–145)

## 2016-09-24 LAB — CBC
HEMATOCRIT: 41.4 % (ref 39.0–52.0)
HEMOGLOBIN: 14 g/dL (ref 13.0–17.0)
MCH: 27.9 pg (ref 26.0–34.0)
MCHC: 33.8 g/dL (ref 30.0–36.0)
MCV: 82.5 fL (ref 78.0–100.0)
Platelets: 280 10*3/uL (ref 150–400)
RBC: 5.02 MIL/uL (ref 4.22–5.81)
RDW: 13.7 % (ref 11.5–15.5)
WBC: 7.4 10*3/uL (ref 4.0–10.5)

## 2016-09-24 MED ORDER — ONDANSETRON HCL 4 MG/2ML IJ SOLN
4.0000 mg | Freq: Once | INTRAMUSCULAR | Status: AC
  Administered 2016-09-24: 4 mg via INTRAVENOUS
  Filled 2016-09-24: qty 2

## 2016-09-24 MED ORDER — HYDROMORPHONE HCL 1 MG/ML IJ SOLN
1.0000 mg | Freq: Once | INTRAMUSCULAR | Status: AC
  Administered 2016-09-24: 1 mg via INTRAVENOUS
  Filled 2016-09-24: qty 1

## 2016-09-24 MED ORDER — ONDANSETRON 4 MG PO TBDP: ORAL_TABLET | ORAL | 0 refills | Status: DC

## 2016-09-24 MED ORDER — OXYCODONE-ACETAMINOPHEN 5-325 MG PO TABS: 1.0000 | ORAL_TABLET | Freq: Four times a day (QID) | ORAL | 0 refills | Status: DC | PRN
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
## 2016-09-24 NOTE — ED Triage Notes (Signed)
Patient complaining of right flank pain x 2 days. Denies dysuria. States he has chronic back pain.

## 2016-09-24 NOTE — ED Notes (Signed)
Pt states understanding of care given and follow up instructions.  Pt ambulated from ED with SO.  A/O and steady gait.  Pt instructed no drinking ETOH or driving a vehicle upon discharge or while taking dispense medication.  Disp 6 pack of Percocet.

## 2016-09-24 NOTE — Discharge Instructions (Signed)
Follow up with your md in 2-3 days °

## 2016-09-30 DIAGNOSIS — M545 Low back pain: Secondary | ICD-10-CM | POA: Diagnosis not present

## 2016-09-30 DIAGNOSIS — G8929 Other chronic pain: Secondary | ICD-10-CM | POA: Diagnosis not present

## 2016-09-30 DIAGNOSIS — R1011 Right upper quadrant pain: Secondary | ICD-10-CM | POA: Diagnosis not present

## 2016-09-30 DIAGNOSIS — K760 Fatty (change of) liver, not elsewhere classified: Secondary | ICD-10-CM | POA: Diagnosis not present

## 2016-09-30 DIAGNOSIS — R109 Unspecified abdominal pain: Secondary | ICD-10-CM | POA: Diagnosis not present

## 2016-09-30 MED FILL — Hydrocodone-Acetaminophen Tab 5-325 MG: ORAL | Qty: 6 | Status: AC

## 2016-10-02 ENCOUNTER — Other Ambulatory Visit: Payer: Self-pay | Admitting: Nurse Practitioner

## 2016-10-02 DIAGNOSIS — B1920 Unspecified viral hepatitis C without hepatic coma: Secondary | ICD-10-CM

## 2016-10-02 DIAGNOSIS — K760 Fatty (change of) liver, not elsewhere classified: Secondary | ICD-10-CM

## 2016-10-04 ENCOUNTER — Ambulatory Visit (HOSPITAL_COMMUNITY)
Admission: RE | Admit: 2016-10-04 | Discharge: 2016-10-04 | Disposition: A | Discharge status: Home / Self Care | Payer: Medicare Other | Source: Ambulatory Visit | Attending: Nurse Practitioner | Admitting: Nurse Practitioner

## 2016-10-04 DIAGNOSIS — B1920 Unspecified viral hepatitis C without hepatic coma: Secondary | ICD-10-CM | POA: Diagnosis not present

## 2016-10-04 DIAGNOSIS — K760 Fatty (change of) liver, not elsewhere classified: Secondary | ICD-10-CM | POA: Insufficient documentation

## 2016-10-04 DIAGNOSIS — B182 Chronic viral hepatitis C: Secondary | ICD-10-CM | POA: Diagnosis not present

## 2016-10-12 ENCOUNTER — Encounter (HOSPITAL_COMMUNITY): Payer: Self-pay | Admitting: Emergency Medicine

## 2016-10-12 ENCOUNTER — Emergency Department (HOSPITAL_COMMUNITY)
Admission: EM | Admit: 2016-10-12 | Discharge: 2016-10-12 | Disposition: A | Discharge status: Home / Self Care | Payer: Medicare Other | Attending: Emergency Medicine | Admitting: Emergency Medicine

## 2016-10-12 DIAGNOSIS — R109 Unspecified abdominal pain: Secondary | ICD-10-CM | POA: Diagnosis not present

## 2016-10-12 DIAGNOSIS — F17210 Nicotine dependence, cigarettes, uncomplicated: Secondary | ICD-10-CM | POA: Diagnosis not present

## 2016-10-12 DIAGNOSIS — R062 Wheezing: Secondary | ICD-10-CM | POA: Insufficient documentation

## 2016-10-12 DIAGNOSIS — R945 Abnormal results of liver function studies: Secondary | ICD-10-CM | POA: Insufficient documentation

## 2016-10-12 DIAGNOSIS — M545 Low back pain: Secondary | ICD-10-CM | POA: Diagnosis not present

## 2016-10-12 DIAGNOSIS — R7989 Other specified abnormal findings of blood chemistry: Secondary | ICD-10-CM

## 2016-10-12 LAB — LIPASE, BLOOD: Lipase: 23 U/L (ref 11–51)

## 2016-10-12 LAB — URINALYSIS, ROUTINE W REFLEX MICROSCOPIC
Bilirubin Urine: NEGATIVE
Glucose, UA: NEGATIVE mg/dL
HGB URINE DIPSTICK: NEGATIVE
Ketones, ur: NEGATIVE mg/dL
LEUKOCYTES UA: NEGATIVE
Nitrite: NEGATIVE
Protein, ur: NEGATIVE mg/dL
SPECIFIC GRAVITY, URINE: 1.01 (ref 1.005–1.030)
pH: 8 (ref 5.0–8.0)

## 2016-10-12 LAB — CBC WITH DIFFERENTIAL/PLATELET
Basophils Absolute: 0 10*3/uL (ref 0.0–0.1)
Basophils Relative: 1 %
EOS PCT: 2 %
Eosinophils Absolute: 0.2 10*3/uL (ref 0.0–0.7)
HCT: 41.8 % (ref 39.0–52.0)
HEMOGLOBIN: 14.2 g/dL (ref 13.0–17.0)
LYMPHS ABS: 2.3 10*3/uL (ref 0.7–4.0)
LYMPHS PCT: 32 %
MCH: 28 pg (ref 26.0–34.0)
MCHC: 34 g/dL (ref 30.0–36.0)
MCV: 82.4 fL (ref 78.0–100.0)
Monocytes Absolute: 0.5 10*3/uL (ref 0.1–1.0)
Monocytes Relative: 6 %
Neutro Abs: 4.3 10*3/uL (ref 1.7–7.7)
Neutrophils Relative %: 59 %
Platelets: 297 10*3/uL (ref 150–400)
RBC: 5.07 MIL/uL (ref 4.22–5.81)
RDW: 14.3 % (ref 11.5–15.5)
WBC: 7.2 10*3/uL (ref 4.0–10.5)

## 2016-10-12 LAB — COMPREHENSIVE METABOLIC PANEL
ALK PHOS: 66 U/L (ref 38–126)
ALT: 93 U/L — AB (ref 17–63)
AST: 45 U/L — ABNORMAL HIGH (ref 15–41)
Albumin: 4 g/dL (ref 3.5–5.0)
Anion gap: 9 (ref 5–15)
BUN: 13 mg/dL (ref 6–20)
CO2: 24 mmol/L (ref 22–32)
Calcium: 9 mg/dL (ref 8.9–10.3)
Chloride: 101 mmol/L (ref 101–111)
Creatinine, Ser: 0.82 mg/dL (ref 0.61–1.24)
Glucose, Bld: 97 mg/dL (ref 65–99)
Potassium: 3.9 mmol/L (ref 3.5–5.1)
Sodium: 134 mmol/L — ABNORMAL LOW (ref 135–145)
Total Bilirubin: 0.5 mg/dL (ref 0.3–1.2)
Total Protein: 7.6 g/dL (ref 6.5–8.1)

## 2016-10-12 LAB — ACETAMINOPHEN LEVEL

## 2016-10-12 MED ORDER — OXYCODONE HCL 5 MG PO TABS: 5.0000 mg | ORAL_TABLET | Freq: Four times a day (QID) | ORAL | 0 refills | Status: DC | PRN

## 2016-10-12 MED ORDER — OXYCODONE HCL 5 MG PO TABS
5.0000 mg | ORAL_TABLET | Freq: Once | ORAL | Status: AC
  Administered 2016-10-12: 5 mg via ORAL
  Filled 2016-10-12: qty 1

## 2016-10-12 MED ORDER — METHOCARBAMOL 500 MG PO TABS: 1000.0000 mg | ORAL_TABLET | Freq: Four times a day (QID) | ORAL | 0 refills | Status: DC | PRN

## 2016-10-12 MED ORDER — METHOCARBAMOL 500 MG PO TABS
1000.0000 mg | ORAL_TABLET | Freq: Once | ORAL | Status: AC
  Administered 2016-10-12: 1000 mg via ORAL
  Filled 2016-10-12: qty 2

## 2016-10-12 NOTE — ED Triage Notes (Signed)
Pt reports RT sided flank pain x 2 weeks. States he had an US performed and was told he has "fibrosis of the liver." States pain has not gotten any better. Pt also reports diarrhea.

## 2016-10-12 NOTE — Discharge Instructions (Signed)
Avoid Tylenol or any medication containing Tylenol and it. Avoid alcohol. Follow-up with your gastroenterologist and chronic pain management.

## 2016-10-12 NOTE — ED Notes (Signed)
Pt reports after ultrasound, he has been referred for a cardiology work up, an endoscopy as well as a colonoscopy  He has also been referred to a pain clinic save thay have not called him at this time

## 2016-10-12 NOTE — ED Notes (Signed)
Dr Darrick PennaFields is GI physician
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
## 2016-10-14 LAB — HEPATITIS PANEL, ACUTE
HCV Ab: >11 s/co ratio — ABNORMAL HIGH (ref 0.0–0.9)
HEP B C IGM: NEGATIVE
HEP B S AG: NEGATIVE
Hep A IgM: NEGATIVE

## 2016-12-10 DIAGNOSIS — I10 Essential (primary) hypertension: Secondary | ICD-10-CM | POA: Diagnosis not present

## 2016-12-10 DIAGNOSIS — G40309 Generalized idiopathic epilepsy and epileptic syndromes, not intractable, without status epilepticus: Secondary | ICD-10-CM | POA: Diagnosis not present

## 2016-12-17 DIAGNOSIS — G8929 Other chronic pain: Secondary | ICD-10-CM | POA: Diagnosis not present

## 2016-12-17 DIAGNOSIS — G40309 Generalized idiopathic epilepsy and epileptic syndromes, not intractable, without status epilepticus: Secondary | ICD-10-CM | POA: Diagnosis not present

## 2016-12-17 DIAGNOSIS — M545 Low back pain: Secondary | ICD-10-CM | POA: Diagnosis not present

## 2016-12-17 DIAGNOSIS — B1920 Unspecified viral hepatitis C without hepatic coma: Secondary | ICD-10-CM | POA: Insufficient documentation

## 2016-12-17 DIAGNOSIS — I10 Essential (primary) hypertension: Secondary | ICD-10-CM | POA: Diagnosis not present

## 2016-12-17 DIAGNOSIS — F209 Schizophrenia, unspecified: Secondary | ICD-10-CM | POA: Diagnosis not present

## 2016-12-24 DIAGNOSIS — Z79899 Other long term (current) drug therapy: Secondary | ICD-10-CM | POA: Diagnosis not present

## 2016-12-24 DIAGNOSIS — F3181 Bipolar II disorder: Secondary | ICD-10-CM | POA: Diagnosis not present

## 2016-12-30 DIAGNOSIS — G894 Chronic pain syndrome: Secondary | ICD-10-CM | POA: Insufficient documentation

## 2016-12-30 DIAGNOSIS — M25511 Pain in right shoulder: Secondary | ICD-10-CM

## 2016-12-30 DIAGNOSIS — F1190 Opioid use, unspecified, uncomplicated: Secondary | ICD-10-CM | POA: Insufficient documentation

## 2016-12-30 DIAGNOSIS — M7918 Myalgia, other site: Secondary | ICD-10-CM | POA: Insufficient documentation

## 2016-12-30 DIAGNOSIS — G8929 Other chronic pain: Secondary | ICD-10-CM | POA: Insufficient documentation

## 2016-12-30 DIAGNOSIS — M542 Cervicalgia: Secondary | ICD-10-CM

## 2016-12-30 DIAGNOSIS — Z79899 Other long term (current) drug therapy: Secondary | ICD-10-CM | POA: Insufficient documentation

## 2016-12-30 DIAGNOSIS — Z79891 Long term (current) use of opiate analgesic: Secondary | ICD-10-CM | POA: Insufficient documentation

## 2016-12-30 NOTE — Progress Notes (Signed)
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 maxillary sinuses. Bilateral mastoid air cells are clear.  No evidence of calvarial fracture.  CT MAXILLOFACIAL FINDINGS  Mild soft tissue swelling overlying the left frontal bone and left maxilla.  No evidence of maxillofacial fracture.  The bilateral orbits, including the globes and retroconal soft tissues, are within normal limits.  Mandible is intact. Bilateral mandibular condyles are well-seated in the TMJs.  Moderate mucosal thickening of the bilateral maxillary sinuses. Mild partial opacification of the left ethmoid and sphenoid sinuses. Mastoid air cells are clear.  CT CERVICAL SPINE FINDINGS  Straightening of the cervical spine.  No evidence of fracture or dislocation. Vertebral body heights and intervertebral disc spaces are maintained. Dens appears intact.  No prevertebral soft tissue swelling.  Mild degenerative changes, most prominent at C5-6.  Visualized  thyroid is unremarkable.  Visualized lung apices are clear.  IMPRESSION: Mild soft tissue swelling overlying the left frontal bone and left maxilla. No evidence of maxillofacial fracture.  Normal head CT.  No evidence of traumatic injury to the cervical spine. Mild degenerative changes.   Electronically Signed   By: Julian Hy M.D.   On: 01/07/2016 18:27   Results for orders placed or performed during the hospital  encounter of 02/21/12  MR Brain W Wo Contrast   Narrative   This examination was performed at [place] at [facility]. The interpretation will be provided by [facility].  BUN and creatinine were obtained on site at [place] at [address] Results:  BUN 18 mg/dL,  Creatinine 0.7 mg/dL.  Original Report Authenticated By: Truett Perna, M.D.   Psychological-Psychiatric History: Psychiatric disorder, Anxiety, Depression and Insomnia Gastrointestinal History: Reflux or heatburn and Hepatitis Genitourinary History: Kidney disease Hematological History: Negative for anticoagulant therapy, anemia, bruising or bleeding easily, hemophilia, sickle cell disease or trait, thrombocytopenia or coagulupathies Endocrine History: Negative for diabetes or thyroid disease Rheumatologic History: Negative for lupus, osteoarthritis, rheumatoid arthritis, myositis, polymyositis or fibromyagia Musculoskeletal History: Negative for myasthenia gravis, muscular dystrophy, multiple sclerosis or malignant hyperthermia Work History: Disabled  Allergies  Mr. Tulloch is allergic to amoxicillin and ibuprofen.  Laboratory Chemistry  Inflammation Markers Lab Results  Component Value Date   CRP 3.8 12/31/2016   ESRSEDRATE 14 12/31/2016   (CRP: Acute Phase) (ESR: Chronic Phase) Renal Function Markers Lab Results  Component Value Date   BUN 13 12/31/2016   CREATININE 0.77 12/31/2016   GFRAA 128 12/31/2016   GFRNONAA  111 12/31/2016   Hepatic Function Markers Lab Results  Component Value Date   AST 26 12/31/2016   ALT 30 12/31/2016   ALBUMIN 4.1 12/31/2016   ALKPHOS 57 12/31/2016   HCVAB >11.0 (H) 10/12/2016   Electrolytes Lab Results  Component Value Date   NA 135 12/31/2016   K 4.6 12/31/2016   CL 97 12/31/2016   CALCIUM 9.4 12/31/2016   MG 2.0 12/31/2016   Neuropathy Markers Lab Results  Component Value Date   VITAMINB12 707 12/31/2016   Bone Pathology Markers Lab Results  Component Value Date   ALKPHOS 57 12/31/2016   25OHVITD1 WILL FOLLOW 12/31/2016   25OHVITD2 WILL FOLLOW 12/31/2016   25OHVITD3 WILL FOLLOW 12/31/2016   CALCIUM 9.4 12/31/2016   Coagulation Parameters Lab Results  Component Value Date   INR 1.0 02/13/2009   LABPROT 13.8 02/13/2009   APTT 36 02/13/2009   PLT 297 10/12/2016   Cardiovascular Markers Lab Results  Component Value Date   HGB 14.2 10/12/2016   HCT 41.8 10/12/2016   Note: Lab results reviewed.  PFSH  Drug: Mr. Barbaro  reports that he uses drugs, including Marijuana. Alcohol:  reports that he drinks alcohol. Tobacco:  reports that he has been smoking Cigarettes.  He has a 25.00 pack-year smoking history. He has never used smokeless tobacco. Medical:  has a past medical history of Arthritis; Chronic back pain; Chronic depression; GERD (gastroesophageal reflux disease); Hepatitis C; History of bipolar disorder; History of migraine; History of multiple trauma; Hypertension; Schizophrenia ([place]); and Seizures ([place]). Family: family history includes Heart attack in his maternal uncle; Hypertension in his brother and father.  Past Surgical History:  Procedure Laterality Date   CATARACT EXTRACTION W/PHACO Right 10/05/2013   Procedure: CATARACT EXTRACTION PHACO AND INTRAOCULAR LENS PLACEMENT RIGHT EYE;  Surgeon: Elta Guadeloupe T. Gershon Crane, MD;  Location: [facility];  Service: Ophthalmology;  Laterality: Right;  CDE:0.49   CATARACT EXTRACTION W/PHACO Left 11/09/2013    Procedure: CATARACT EXTRACTION PHACO AND INTRAOCULAR LENS PLACEMENT (IOC);  Surgeon: Elta Guadeloupe T. Gershon Crane, MD;  Location: [facility];  Service: Ophthalmology;  Laterality: Left;  CDE: 4.07   DECOMPRESSION FASCIOTOMY LEG Left    WISDOM TOOTH EXTRACTION     Active Ambulatory Problems  Diagnosis Date Noted   Abdominal pain, RUQ (right upper quadrant) 08/19/2016   Chest pain with low risk for cardiac etiology 09/03/2016   Chronic low back pain (Location of Secondary source of pain) (Bilateral) (R>L) 12/14/2014   Depression 06/13/2014   Fatty infiltration of liver 08/19/2016   FH: colon cancer 08/19/2016   Generalized seizure disorder ([place]) 06/13/2014   GERD (gastroesophageal reflux disease) 08/19/2016   Hepatitis C 12/17/2016   Obesity, unspecified 06/13/2014   Schizophrenia ([place]) 06/13/2014   Hypertension 06/13/2014   SOB (shortness of breath) 08/22/2016   Tobacco abuse 06/13/2014   Chronic pain syndrome 12/30/2016   Long term (current) use of opiate analgesic 12/30/2016   Long term prescription opiate use 12/30/2016   Opiate use 12/30/2016   Long term prescription benzodiazepine use 12/30/2016   Chronic neck pain (Location of Tertiary source of pain) (midline) (Bilateral) (R>L) 12/30/2016   Chronic shoulder pain (Right) 12/30/2016   Musculoskeletal pain 12/30/2016   Disturbance of skin sensation 12/31/2016   Chronic lower extremity pain (Location of Primary Source of Pain) (Left) (L5) 12/31/2016   Chronic radicular lumbar pain (Left) (L5) 12/31/2016   Chronic mid back pain (Bilateral) (R>L) 12/31/2016   Chronic thoracic back pain (Bilateral) (R>L) 01/01/2017   History of nephrolithiasis 01/01/2017   Lumbar facet hypertrophy (Bilateral) (L5-S1) 01/01/2017   Lumbar facet syndrome (Bilateral) (R>L) 01/01/2017   DDD (degenerative disc disease), cervical (mild at C5-6) 01/01/2017   Cervical facet syndrome ([place]) 01/01/2017   DDD (degenerative disc  disease), thoracic (Right T9 10 and T10-11 osteophytes) 01/01/2017   Chronic supraspinatus tendinosis (Right) 01/01/2017   History of cocaine use 01/01/2017   History of marijuana use 01/01/2017   Convicted for criminal activity (selling schedule II controlled substances) 01/01/2017   Resolved Ambulatory Problems    Diagnosis Date Noted   No Resolved Ambulatory Problems   Past Medical History:  Diagnosis Date   Arthritis    Chronic back pain    Chronic depression    GERD (gastroesophageal reflux disease)    Hepatitis C    History of bipolar disorder    History of migraine    History of multiple trauma    Hypertension    Schizophrenia ([place])    Seizures ([place])    Constitutional Exam  General appearance: Well nourished, well developed, and well hydrated. In no apparent acute distress Vitals:   12/31/16 0935  BP: 130/71  Pulse: 93  Resp: 18  Temp: 98.4 F (36.9 C)  TempSrc: Oral  SpO2: 97%  Weight: 294 lb (133.4 kg)  Height: '5\' 11"'  (1.803 m)   BMI Assessment: Estimated body mass index is 41 kg/m as calculated from the following:   Height as of this encounter: '5\' 11"'  (1.803 m).   Weight as of this encounter: 294 lb (133.4 kg).  BMI interpretation table: BMI level Category Range association with higher incidence of chronic pain  <18 kg/m2 Underweight   18.5-24.9 kg/m2 Ideal body weight   25-29.9 kg/m2 Overweight Increased incidence by 20%  30-34.9 kg/m2 Obese (Class I) Increased incidence by 68%  35-39.9 kg/m2 Severe obesity (Class II) Increased incidence by 136%  >40 kg/m2 Extreme obesity (Class III) Increased incidence by 254%   BMI Readings from Last 4 Encounters:  12/31/16 41.00 kg/m  10/12/16 41.84 kg/m  09/24/16 41.84 kg/m  01/07/16 40.73 kg/m   Wt Readings from Last 4 Encounters:  12/31/16 294 lb (133.4 kg)  10/12/16 300 lb (136.1 kg)  09/24/16 300 lb (136.1 kg)  01/07/16 292 lb (132.5 kg)  Psych/Mental status: Alert, oriented x 3  (person, place, & time)       Eyes: PERLA Respiratory: No evidence of acute respiratory distress  Cervical Spine Exam  Inspection: No masses, redness, or swelling Alignment: Symmetrical Functional ROM: Unrestricted ROM      Stability: No instability detected Muscle strength & Tone: Functionally intact Sensory: Unimpaired Palpation: No palpable anomalies              Upper Extremity (UE) Exam    Side: Right upper extremity  Side: Left upper extremity  Inspection: No masses, redness, swelling, or asymmetry. No contractures  Inspection: No masses, redness, swelling, or asymmetry. No contractures  Functional ROM: Unrestricted ROM          Functional ROM: Unrestricted ROM          Muscle strength & Tone: Functionally intact  Muscle strength & Tone: Functionally intact  Sensory: Unimpaired  Sensory: Unimpaired  Palpation: No palpable anomalies              Palpation: No palpable anomalies              Specialized Test(s): Deferred         Specialized Test(s): Deferred          Thoracic Spine Exam  Inspection: No masses, redness, or swelling Alignment: Symmetrical Functional ROM: Unrestricted ROM Stability: No instability detected Sensory: Unimpaired Muscle strength & Tone: No palpable anomalies  Lumbar Spine Exam  Inspection: No masses, redness, or swelling Alignment: Symmetrical Functional ROM: Unrestricted ROM      Stability: No instability detected Muscle strength & Tone: Functionally intact Sensory: Unimpaired Palpation: No palpable anomalies       Provocative Tests: Lumbar Hyperextension and rotation test: evaluation deferred today       Patrick's Maneuver: evaluation deferred today                    Gait & Posture Assessment  Ambulation: Unassisted Gait: Relatively normal for age and body habitus Posture: WNL   Lower Extremity Exam    Side: Right lower extremity  Side: Left lower extremity  Inspection: No masses, redness, swelling, or asymmetry. No contractures   Inspection: No masses, redness, swelling, or asymmetry. No contractures  Functional ROM: Unrestricted ROM          Functional ROM: Unrestricted ROM          Muscle strength & Tone: Functionally intact  Muscle strength & Tone: Functionally intact  Sensory: Unimpaired  Sensory: Unimpaired  Palpation: No palpable anomalies  Palpation: No palpable anomalies   Assessment  Primary Diagnosis & Pertinent Problem List: The primary encounter diagnosis was Chronic pain of left lower extremity. Diagnoses of Chronic radicular lumbar pain (Left) (L5), Chronic low back pain, Lumbar facet hypertrophy (Bilateral) (L5-S1), Lumbar facet syndrome (Bilateral) (R>L), History of nephrolithiasis, Chronic neck pain, DDD (degenerative disc disease), cervical (mild at C5-6), Cervical facet syndrome (HCC), Chronic shoulder pain (Right), Chronic supraspinatus tendinosis (Right), Chronic bilateral thoracic back pain, Chronic midline thoracic back pain, DDD (degenerative disc disease), thoracic (Right T9 10 and T10-11 osteophytes), Chronic pain syndrome, Musculoskeletal pain, Disturbance of skin sensation, Schizophrenia, unspecified type (HCC), SOB (shortness of breath), Hepatitis C virus infection without hepatic coma, unspecified chronicity, Long term (current) use of opiate analgesic, Long term prescription opiate use, Long term prescription benzodiazepine use, Opiate use, History of cocaine use, History of marijuana use, and Convicted for criminal activity (selling schedule II controlled substances)  were also pertinent to this visit.  Visit Diagnosis: 1. Chronic pain of left lower extremity   2. Chronic radicular lumbar pain (Left) (L5)   3. Chronic low back pain   4. Lumbar facet hypertrophy (Bilateral) (L5-S1)   5. Lumbar facet syndrome (Bilateral) (R>L)   6. History of nephrolithiasis   7. Chronic neck pain   8. DDD (degenerative disc disease), cervical (mild at C5-6)   9. Cervical facet syndrome (HCC)   10. Chronic  shoulder pain (Right)   11. Chronic supraspinatus tendinosis (Right)   12. Chronic bilateral thoracic back pain   13. Chronic midline thoracic back pain   14. DDD (degenerative disc disease), thoracic (Right T9 10 and T10-11 osteophytes)   15. Chronic pain syndrome   16. Musculoskeletal pain   17. Disturbance of skin sensation   18. Schizophrenia, unspecified type ([place])   19. SOB (shortness of breath)   20. Hepatitis C virus infection without hepatic coma, unspecified chronicity   21. Long term (current) use of opiate analgesic   22. Long term prescription opiate use   23. Long term prescription benzodiazepine use   24. Opiate use   25. History of cocaine use   26. History of marijuana use   27. Convicted for criminal activity (selling schedule II controlled substances)    Plan of Care  Initial treatment plan:  Please be advised that as per protocol, today's visit has been an evaluation only. We have not taken over the patient's controlled substance management.  Problem-specific plan: No problem-specific Assessment & Plan notes found for this encounter.  Ordered Lab-work, Procedure(s), Referral(s), & Consult(s): Orders Placed This Encounter  Procedures   DG Cervical Spine Complete   DG Lumbar Spine Complete W/Bend   MR LUMBAR SPINE WO CONTRAST   DG Thoracic Spine 2 View   DG Shoulder Right   Compliance Drug Analysis, Ur   Comprehensive metabolic panel   C-reactive protein   Magnesium   Sedimentation rate   Vitamin B12   25-Hydroxyvitamin D Lcms D2+D3   Ambulatory referral to Psychology   NCV with EMG(electromyography)   Pharmacotherapy: Medications ordered:  No orders of the defined types were placed in this encounter.  Medications administered during this visit: Mr. Ferencz had no medications administered during this visit.   Pharmacotherapy under consideration:  Opioid Analgesics: The patient was informed that there is no guarantee that he would be a  candidate for opioid analgesics. The decision will be made following CDC guidelines. This decision will be based on the results of diagnostic studies, as well as Mr. Oscar risk profile. Further review has revealed that the patient is not an appropriate candidate for the use of opioid analgesics. Membrane stabilizer: To be determined at a later time Muscle relaxant: To be determined at a later time NSAID: To be determined at a later time Other analgesic(s): To be determined at a later time   Interventional therapies under consideration: Mr. Radermacher was informed that there is no guarantee that he would be a candidate for interventional therapies. The decision will be based on the results of diagnostic studies, as well as Mr. Grisso risk profile.  Possible procedure(s): Diagnostic left L4-5 lumbar epidural steroid injection  Diagnostic left L5-S1 transforaminal epidural steroid injection  Diagnostic bilateral lumbar facet block  Possible bilateral lumbar facet RFA  Diagnostic right cervical epidural steroid injection  Diagnostic bilateral cervical facet block  Possible bilateral cervical facet RFA    Provider-requested follow-up: Return for 2nd Visit, after MedPsych  eval.  Future Appointments Date Time Provider [place]  01/14/2017 2:45 PM [facility]-MR [facility]    Primary Care Physician: Madelyn Brunner, MD Location: [facility] Note by: Kathlen Brunswick. Dossie Arbour, M.D, DABA, DABAPM, DABPM, DABIPP, FIPP Date: 12/31/2016; Time: 11:27 AM  Patient instructions provided during this appointment: Patient Instructions   ____________________________________________________________________________________________ Get labs and xrays done today.  The medical psychologist will call you for an appointment.  When you have completed these, call our office to schedule a follow up appointment. You will be sent to [facility] for a nerve conduction test.       Pain Scale  Introduction: The pain score used by this practice is the Verbal Numerical Rating Scale (VNRS-11). This is an 11-point scale. It is for adults and children 10 years or older. There are significant differences in how the pain score is reported, used, and applied. Forget everything you learned in the past and learn this scoring system.  General Information: The scale should reflect your current level of pain. Unless you are specifically asked for the level of your worst pain, or your average pain. If you are asked for one of these two, then it should be understood that it is over the past 24 hours.  Basic Activities of Daily Living (ADL): Personal hygiene, dressing, eating, transferring, and using restroom.  Instructions: Most patients tend to report their level of pain as a combination of two factors, their physical pain and their psychosocial pain. This last one is also known as suffering and it is reflection of how physical pain affects you socially and psychologically. From now on, report them separately. From this point on, when asked to report your pain level, report only your physical pain. Use the following table for reference.  Pain Clinic Pain Levels (0-5/10)  Pain Level Score  Description  No Pain 0   Mild pain 1 Nagging, annoying, but does not interfere with basic activities of daily living (ADL). Patients are able to eat, bathe, get dressed, toileting (being able to get on and off the toilet and perform personal hygiene functions), transfer (move in and out of bed or a chair without assistance), and maintain continence (able to control bladder and bowel functions). Blood pressure and heart rate are unaffected. A normal heart rate for a healthy adult ranges from 60 to 100 bpm (beats per minute).   Mild to moderate pain 2 Noticeable and distracting. Impossible to hide from other people. More frequent flare-ups. Still possible to adapt and function close to normal. It can  be very annoying and may have occasional stronger flare-ups. With discipline, patients may get used to it and adapt.   Moderate pain 3 Interferes significantly with activities of daily living (ADL). It becomes difficult to feed, bathe, get dressed, get on and off the toilet or to perform personal hygiene functions. Difficult to get in and out of bed or a chair without assistance. Very distracting. With effort, it can be ignored when deeply involved in activities.   Moderately severe pain 4 Impossible to ignore for more than a few minutes. With effort, patients may still be able to manage work or participate in some social activities. Very difficult to concentrate. Signs of autonomic nervous system discharge are evident: dilated pupils (mydriasis); mild sweating (diaphoresis); sleep interference. Heart rate becomes elevated (>115 bpm). Diastolic blood pressure (lower number) rises above 100 mmHg. Patients find relief in laying down and not moving.   Severe pain 5 Intense  and extremely unpleasant. Associated with frowning face and frequent crying. Pain overwhelms the senses.  Ability to do any activity or maintain social relationships becomes significantly limited. Conversation becomes difficult. Pacing back and forth is common, as getting into a comfortable position is nearly impossible. Pain wakes you up from deep sleep. Physical signs will be obvious: pupillary dilation; increased sweating; goosebumps; brisk reflexes; cold, clammy hands and feet; nausea, vomiting or dry heaves; loss of appetite; significant sleep disturbance with inability to fall asleep or to remain asleep. When persistent, significant weight loss is observed due to the complete loss of appetite and sleep deprivation.  Blood pressure and heart rate becomes significantly elevated. Caution: If elevated blood pressure triggers a pounding headache associated with blurred vision, then the patient should immediately seek attention at an urgent  or emergency care unit, as these may be signs of an impending stroke.    Emergency Department Pain Levels (6-10/10)  Emergency Room Pain 6 Severely limiting. Requires emergency care and should not be seen or managed at an outpatient pain management facility. Communication becomes difficult and requires great effort. Assistance to reach the emergency department may be required. Facial flushing and profuse sweating along with potentially dangerous increases in heart rate and blood pressure will be evident.   Distressing pain 7 Self-care is very difficult. Assistance is required to transport, or use restroom. Assistance to reach the emergency department will be required. Tasks requiring coordination, such as bathing and getting dressed become very difficult.   Disabling pain 8 Self-care is no longer possible. At this level, pain is disabling. The individual is unable to do even the most basic activities such as walking, eating, bathing, dressing, transferring to a bed, or toileting. Fine motor skills are lost. It is difficult to think clearly.   Incapacitating pain 9 Pain becomes incapacitating. Thought processing is no longer possible. Difficult to remember your own name. Control of movement and coordination are lost.   The worst pain imaginable 10 At this level, most patients pass out from pain. When this level is reached, collapse of the autonomic nervous system occurs, leading to a sudden drop in blood pressure and heart rate. This in turn results in a temporary and dramatic drop in blood flow to the brain, leading to a loss of consciousness. Fainting is one of the bodys self defense mechanisms. Passing out puts the brain in a calmed state and causes it to shut down for a while, in order to begin the healing process.    Summary: 1. Refer to this scale when providing [place] with your pain level. 2. Be accurate and careful when reporting your pain level. This will help with your  care. 3. Over-reporting your pain level will lead to loss of credibility. 4. Even a level of 1/10 means that there is pain and will be treated at our facility. 5. High, inaccurate reporting will be documented as Symptom Exaggeration, leading to loss of credibility and suspicions of possible secondary gains such as obtaining more narcotics, or wanting to appear disabled, for fraudulent reasons. 6. Only pain levels of 5 or below will be seen at our facility. 7. Pain levels of 6 and above will be sent to the Emergency Department and the appointment cancelled. ____________________________________________________________________________________________   ____________________________________________________________________________________________  Appointment Policy  It is our goal and responsibility to provide the medical community with assistance in the evaluation and management of patients with chronic pain. Unfortunately our resources are limited. Because we do not have an unlimited amount  of time, or available appointments, we are required to closely monitor and manage their use. The following rules exist to maximize their use:  Patient's responsibilities: 1. Punctuality: You are required to be physically present and registered in our facility at least 30 minutes before your appointment. 2. Tardiness: The cutoff is your appointment time. If you have an appointment scheduled for 10:00 AM and you arrive at 10:01, you will be required to reschedule your appointment.  3. Plan ahead: Always assume that you will encounter traffic on your way in. Plan for it. If you are dependent on a driver, make sure they understand these rules and the need to arrive early. 4. Other appointments and responsibilities: Avoid scheduling any other appointments before or after your pain clinic appointments.  5. Be prepared: Write down everything that you need to discuss with your healthcare provider and give this  information to the admitting nurse. Write down the medications that you will need refilled. Bring your pills and bottles (even the empty ones), to all of your appointments, except for those where a procedure is scheduled. 6. No children or pets: Find someone to take care of them. It is not appropriate to bring them in. 7. Scheduling changes: We request "advanced notification" of any changes or cancellations. 8. Advanced notification: Defined as a time period of more than 24 hours prior to the originally scheduled appointment. This allows for the appointment to be offered to other patients. 9. Rescheduling: When a visit is rescheduled, it will require the cancellation of the original appointment. For this reason they both fall within the category of "Cancellations".  10. Cancellations: They require advanced notification. Any cancellation less than 24 hours before the  appointment will be recorded as a "No Show". 11. No Show: Defined as an unkept appointment where the patient failed to notify or declare to the practice their intention or inability to keep the appointment.  Corrective process for repeat offenders:  1. Tardiness: Three (3) episodes of rescheduling due to late arrivals will be recorded as one (1) "No Show". 2. Cancellation or reschedule: Three (3) cancellations or rescheduling will be recorded as one (1) "No Show". 3. "No Shows": Three (3) "No Shows" within a 12 month period will result in discharge from the practice.  ____________________________________________________________________________________________  ____________________________________________________________________________________________  DRUG HOLIDAYS  Definitions Tolerance: defined as the progressively decreased responsiveness to a drug. Occurs when the drug is used repeatedly and the body adapts to the continued presence of the drug. As a result, a larger dose of the drug is needed to achieve the effect originally  obtained by a smaller dose. It is thought to be due to the formation of excess opioid receptors.  Drug Holiday: is when a patient stops taking a medication(s) for a period of time; anywhere from a few days to several weeks.  Withdrawals: refers to the wide range of symptoms that occur after stopping or dramatically reducing opiate drugs after heavy and prolonged use. Withdrawal symptoms do not occur to patients that use low dose opioids, or those who take the medication sporadically. Contrary to benzodiazepine (example: Valium, Xanax, etc.) or alcohol withdrawals (Delirium Tremens), opioid withdrawals are not lethal. Withdrawals are the physical manifestation of the body getting rid of the excess receptors.  Purpose To eliminate tolerance.  Duration of Holiday 14 consecutive days. (2 weeks)  Expected Symptoms Early symptoms of withdrawal include:  Agitation  Anxiety  Muscle aches  Increased tearing  Insomnia  Runny nose  Sweating  Yawning  Late symptoms of withdrawal include:  Abdominal cramping  Diarrhea  Dilated pupils  Goose bumps  Nausea  Vomiting  Opioid withdrawal reactions are very uncomfortable but are not life-threatening. Symptoms usually start within 12 hours of last opioid dose and within 30 hours of last methadone exposure.  Duration of Symptoms 48 to 72 hours for short acting medications and 2 to 14 days for methadone.  Treatment  Clonidine (Catapres) or tizanidine (Zanaflex) for agitation, sweating, tearing, runny nose.  Promethazine (Phenergan) for nausea, vomiting.  NSAIDs for pain.  Benefits  Improved effectiveness of opioids.  Decreased opioid dose needed to achieve benefits.  Improved pain with lesser dose. ____________________________________________________________________________________________

## 2016-12-31 ENCOUNTER — Ambulatory Visit
Admission: RE | Admit: 2016-12-31 | Discharge: 2016-12-31 | Disposition: A | Discharge status: Home / Self Care | Payer: Medicare Other | Source: Ambulatory Visit | Attending: Pain Medicine | Admitting: Pain Medicine

## 2016-12-31 ENCOUNTER — Encounter: Payer: Self-pay | Admitting: Pain Medicine

## 2016-12-31 ENCOUNTER — Ambulatory Visit: Payer: Medicare Other | Attending: Pain Medicine | Admitting: Pain Medicine

## 2016-12-31 ENCOUNTER — Other Ambulatory Visit: Payer: Self-pay | Admitting: Pain Medicine

## 2016-12-31 VITALS — BP 130/71 | HR 93 | Temp 98.4°F | Resp 18 | Ht 71.0 in | Wt 294.0 lb

## 2016-12-31 DIAGNOSIS — R209 Unspecified disturbances of skin sensation: Secondary | ICD-10-CM | POA: Diagnosis not present

## 2016-12-31 DIAGNOSIS — F17200 Nicotine dependence, unspecified, uncomplicated: Secondary | ICD-10-CM | POA: Insufficient documentation

## 2016-12-31 DIAGNOSIS — M4696 Unspecified inflammatory spondylopathy, lumbar region: Secondary | ICD-10-CM | POA: Diagnosis not present

## 2016-12-31 DIAGNOSIS — M791 Myalgia: Secondary | ICD-10-CM | POA: Insufficient documentation

## 2016-12-31 DIAGNOSIS — B1920 Unspecified viral hepatitis C without hepatic coma: Secondary | ICD-10-CM

## 2016-12-31 DIAGNOSIS — M47816 Spondylosis without myelopathy or radiculopathy, lumbar region: Secondary | ICD-10-CM

## 2016-12-31 DIAGNOSIS — F1410 Cocaine abuse, uncomplicated: Secondary | ICD-10-CM | POA: Diagnosis not present

## 2016-12-31 DIAGNOSIS — Z886 Allergy status to analgesic agent status: Secondary | ICD-10-CM | POA: Insufficient documentation

## 2016-12-31 DIAGNOSIS — M8938 Hypertrophy of bone, other site: Secondary | ICD-10-CM | POA: Insufficient documentation

## 2016-12-31 DIAGNOSIS — R0602 Shortness of breath: Secondary | ICD-10-CM

## 2016-12-31 DIAGNOSIS — Z6841 Body Mass Index (BMI) 40.0 and over, adult: Secondary | ICD-10-CM | POA: Insufficient documentation

## 2016-12-31 DIAGNOSIS — M25511 Pain in right shoulder: Secondary | ICD-10-CM

## 2016-12-31 DIAGNOSIS — E669 Obesity, unspecified: Secondary | ICD-10-CM | POA: Diagnosis not present

## 2016-12-31 DIAGNOSIS — M5030 Other cervical disc degeneration, unspecified cervical region: Secondary | ICD-10-CM

## 2016-12-31 DIAGNOSIS — M5134 Other intervertebral disc degeneration, thoracic region: Secondary | ICD-10-CM

## 2016-12-31 DIAGNOSIS — M2578 Osteophyte, vertebrae: Secondary | ICD-10-CM | POA: Diagnosis not present

## 2016-12-31 DIAGNOSIS — F1210 Cannabis abuse, uncomplicated: Secondary | ICD-10-CM | POA: Insufficient documentation

## 2016-12-31 DIAGNOSIS — F1291 Cannabis use, unspecified, in remission: Secondary | ICD-10-CM

## 2016-12-31 DIAGNOSIS — Z79899 Other long term (current) drug therapy: Secondary | ICD-10-CM | POA: Diagnosis not present

## 2016-12-31 DIAGNOSIS — R569 Unspecified convulsions: Secondary | ICD-10-CM | POA: Insufficient documentation

## 2016-12-31 DIAGNOSIS — K219 Gastro-esophageal reflux disease without esophagitis: Secondary | ICD-10-CM | POA: Insufficient documentation

## 2016-12-31 DIAGNOSIS — F1190 Opioid use, unspecified, uncomplicated: Secondary | ICD-10-CM | POA: Diagnosis not present

## 2016-12-31 DIAGNOSIS — Z880 Allergy status to penicillin: Secondary | ICD-10-CM | POA: Insufficient documentation

## 2016-12-31 DIAGNOSIS — M47896 Other spondylosis, lumbar region: Secondary | ICD-10-CM

## 2016-12-31 DIAGNOSIS — M545 Low back pain: Secondary | ICD-10-CM | POA: Insufficient documentation

## 2016-12-31 DIAGNOSIS — G8929 Other chronic pain: Secondary | ICD-10-CM

## 2016-12-31 DIAGNOSIS — G43909 Migraine, unspecified, not intractable, without status migrainosus: Secondary | ICD-10-CM | POA: Diagnosis not present

## 2016-12-31 DIAGNOSIS — I10 Essential (primary) hypertension: Secondary | ICD-10-CM | POA: Insufficient documentation

## 2016-12-31 DIAGNOSIS — M5416 Radiculopathy, lumbar region: Secondary | ICD-10-CM | POA: Insufficient documentation

## 2016-12-31 DIAGNOSIS — M7581 Other shoulder lesions, right shoulder: Secondary | ICD-10-CM | POA: Insufficient documentation

## 2016-12-31 DIAGNOSIS — M542 Cervicalgia: Secondary | ICD-10-CM | POA: Diagnosis not present

## 2016-12-31 DIAGNOSIS — Z79891 Long term (current) use of opiate analgesic: Secondary | ICD-10-CM

## 2016-12-31 DIAGNOSIS — M546 Pain in thoracic spine: Secondary | ICD-10-CM | POA: Diagnosis not present

## 2016-12-31 DIAGNOSIS — M4692 Unspecified inflammatory spondylopathy, cervical region: Secondary | ICD-10-CM | POA: Diagnosis not present

## 2016-12-31 DIAGNOSIS — F319 Bipolar disorder, unspecified: Secondary | ICD-10-CM | POA: Insufficient documentation

## 2016-12-31 DIAGNOSIS — F209 Schizophrenia, unspecified: Secondary | ICD-10-CM | POA: Diagnosis not present

## 2016-12-31 DIAGNOSIS — Z87442 Personal history of urinary calculi: Secondary | ICD-10-CM

## 2016-12-31 DIAGNOSIS — M778 Other enthesopathies, not elsewhere classified: Secondary | ICD-10-CM

## 2016-12-31 DIAGNOSIS — Z650 Conviction in civil and criminal proceedings without imprisonment: Secondary | ICD-10-CM

## 2016-12-31 DIAGNOSIS — Z8249 Family history of ischemic heart disease and other diseases of the circulatory system: Secondary | ICD-10-CM | POA: Insufficient documentation

## 2016-12-31 DIAGNOSIS — M47812 Spondylosis without myelopathy or radiculopathy, cervical region: Secondary | ICD-10-CM

## 2016-12-31 DIAGNOSIS — F1491 Cocaine use, unspecified, in remission: Secondary | ICD-10-CM

## 2016-12-31 DIAGNOSIS — G894 Chronic pain syndrome: Secondary | ICD-10-CM | POA: Diagnosis not present

## 2016-12-31 DIAGNOSIS — M549 Dorsalgia, unspecified: Secondary | ICD-10-CM | POA: Insufficient documentation

## 2016-12-31 DIAGNOSIS — M79605 Pain in left leg: Secondary | ICD-10-CM | POA: Diagnosis not present

## 2016-12-31 DIAGNOSIS — M7918 Myalgia, other site: Secondary | ICD-10-CM

## 2016-12-31 DIAGNOSIS — Z87898 Personal history of other specified conditions: Secondary | ICD-10-CM

## 2016-12-31 DIAGNOSIS — Z87828 Personal history of other (healed) physical injury and trauma: Secondary | ICD-10-CM | POA: Insufficient documentation

## 2016-12-31 NOTE — Progress Notes (Signed)
Safety precautions to be maintained throughout the outpatient stay will include: orient to surroundings, keep bed in low position, maintain call bell within reach at all times, provide assistance with transfer out of bed and ambulation.  

## 2016-12-31 NOTE — Patient Instructions (Addendum)
____________________________________________________________________________________________ Get labs and xrays done today.  The medical psychologist will call you for an appointment.  When you have completed these, call our office to schedule a follow up appointment. You will be sent to [facility] for a nerve conduction test.      Pain Scale  Introduction: The pain score used by this practice is the Verbal Numerical Rating Scale (VNRS-11). This is an 11-point scale. It is for adults and children 10 years or older. There are significant differences in how the pain score is reported, used, and applied. Forget everything you learned in the past and learn this scoring system.  General Information: The scale should reflect your current level of pain. Unless you are specifically asked for the level of your worst pain, or your average pain. If you are asked for one of these two, then it should be understood that it is over the past 24 hours.  Basic Activities of Daily Living (ADL): Personal hygiene, dressing, eating, transferring, and using restroom.  Instructions: Most patients tend to report their level of pain as a combination of two factors, their physical pain and their psychosocial pain. This last one is also known as suffering and it is reflection of how physical pain affects you socially and psychologically. From now on, report them separately. From this point on, when asked to report your pain level, report only your physical pain. Use the following table for reference.  Pain Clinic Pain Levels (0-5/10)  Pain Level Score  Description  No Pain 0   Mild pain 1 Nagging, annoying, but does not interfere with basic activities of daily living (ADL). Patients are able to eat, bathe, get dressed, toileting (being able to get on and off the toilet and perform personal hygiene functions), transfer (move in and out of bed or a chair without assistance), and maintain continence (able to control  bladder and bowel functions). Blood pressure and heart rate are unaffected. A normal heart rate for a healthy adult ranges from 60 to 100 bpm (beats per minute).   Mild to moderate pain 2 Noticeable and distracting. Impossible to hide from other people. More frequent flare-ups. Still possible to adapt and function close to normal. It can be very annoying and may have occasional stronger flare-ups. With discipline, patients may get used to it and adapt.   Moderate pain 3 Interferes significantly with activities of daily living (ADL). It becomes difficult to feed, bathe, get dressed, get on and off the toilet or to perform personal hygiene functions. Difficult to get in and out of bed or a chair without assistance. Very distracting. With effort, it can be ignored when deeply involved in activities.   Moderately severe pain 4 Impossible to ignore for more than a few minutes. With effort, patients may still be able to manage work or participate in some social activities. Very difficult to concentrate. Signs of autonomic nervous system discharge are evident: dilated pupils (mydriasis); mild sweating (diaphoresis); sleep interference. Heart rate becomes elevated (>115 bpm). Diastolic blood pressure (lower number) rises above 100 mmHg. Patients find relief in laying down and not moving.   Severe pain 5 Intense and extremely unpleasant. Associated with frowning face and frequent crying. Pain overwhelms the senses.  Ability to do any activity or maintain social relationships becomes significantly limited. Conversation becomes difficult. Pacing back and forth is common, as getting into a comfortable position is nearly impossible. Pain wakes you up from deep sleep. Physical signs will be obvious: pupillary dilation; increased sweating;  goosebumps; brisk reflexes; cold, clammy hands and feet; nausea, vomiting or dry heaves; loss of appetite; significant sleep disturbance with inability to fall asleep or to remain  asleep. When persistent, significant weight loss is observed due to the complete loss of appetite and sleep deprivation.  Blood pressure and heart rate becomes significantly elevated. Caution: If elevated blood pressure triggers a pounding headache associated with blurred vision, then the patient should immediately seek attention at an urgent or emergency care unit, as these may be signs of an impending stroke.    Emergency Department Pain Levels (6-10/10)  Emergency Room Pain 6 Severely limiting. Requires emergency care and should not be seen or managed at an outpatient pain management facility. Communication becomes difficult and requires great effort. Assistance to reach the emergency department may be required. Facial flushing and profuse sweating along with potentially dangerous increases in heart rate and blood pressure will be evident.   Distressing pain 7 Self-care is very difficult. Assistance is required to transport, or use restroom. Assistance to reach the emergency department will be required. Tasks requiring coordination, such as bathing and getting dressed become very difficult.   Disabling pain 8 Self-care is no longer possible. At this level, pain is disabling. The individual is unable to do even the most basic activities such as walking, eating, bathing, dressing, transferring to a bed, or toileting. Fine motor skills are lost. It is difficult to think clearly.   Incapacitating pain 9 Pain becomes incapacitating. Thought processing is no longer possible. Difficult to remember your own name. Control of movement and coordination are lost.   The worst pain imaginable 10 At this level, most patients pass out from pain. When this level is reached, collapse of the autonomic nervous system occurs, leading to a sudden drop in blood pressure and heart rate. This in turn results in a temporary and dramatic drop in blood flow to the brain, leading to a loss of consciousness. Fainting is one of  the bodys self defense mechanisms. Passing out puts the brain in a calmed state and causes it to shut down for a while, in order to begin the healing process.    Summary: 1. Refer to this scale when providing [place] with your pain level. 2. Be accurate and careful when reporting your pain level. This will help with your care. 3. Over-reporting your pain level will lead to loss of credibility. 4. Even a level of 1/10 means that there is pain and will be treated at our facility. 5. High, inaccurate reporting will be documented as Symptom Exaggeration, leading to loss of credibility and suspicions of possible secondary gains such as obtaining more narcotics, or wanting to appear disabled, for fraudulent reasons. 6. Only pain levels of 5 or below will be seen at our facility. 7. Pain levels of 6 and above will be sent to the Emergency Department and the appointment cancelled. ____________________________________________________________________________________________   ____________________________________________________________________________________________  Appointment Policy  It is our goal and responsibility to provide the medical community with assistance in the evaluation and management of patients with chronic pain. Unfortunately our resources are limited. Because we do not have an unlimited amount of time, or available appointments, we are required to closely monitor and manage their use. The following rules exist to maximize their use:  Patient's responsibilities: 1. Punctuality: You are required to be physically present and registered in our facility at least 30 minutes before your appointment. 2. Tardiness: The cutoff is your appointment time. If you have an appointment scheduled  for 10:00 AM and you arrive at 10:01, you will be required to reschedule your appointment.  3. Plan ahead: Always assume that you will encounter traffic on your way in. Plan for it. If you are dependent on a  driver, make sure they understand these rules and the need to arrive early. 4. Other appointments and responsibilities: Avoid scheduling any other appointments before or after your pain clinic appointments.  5. Be prepared: Write down everything that you need to discuss with your healthcare provider and give this information to the admitting nurse. Write down the medications that you will need refilled. Bring your pills and bottles (even the empty ones), to all of your appointments, except for those where a procedure is scheduled. 6. No children or pets: Find someone to take care of them. It is not appropriate to bring them in. 7. Scheduling changes: We request "advanced notification" of any changes or cancellations. 8. Advanced notification: Defined as a time period of more than 24 hours prior to the originally scheduled appointment. This allows for the appointment to be offered to other patients. 9. Rescheduling: When a visit is rescheduled, it will require the cancellation of the original appointment. For this reason they both fall within the category of "Cancellations".  10. Cancellations: They require advanced notification. Any cancellation less than 24 hours before the  appointment will be recorded as a "No Show". 11. No Show: Defined as an unkept appointment where the patient failed to notify or declare to the practice their intention or inability to keep the appointment.  Corrective process for repeat offenders:  1. Tardiness: Three (3) episodes of rescheduling due to late arrivals will be recorded as one (1) "No Show". 2. Cancellation or reschedule: Three (3) cancellations or rescheduling will be recorded as one (1) "No Show". 3. "No Shows": Three (3) "No Shows" within a 12 month period will result in discharge from the  practice.  ____________________________________________________________________________________________  ____________________________________________________________________________________________  DRUG HOLIDAYS  Definitions Tolerance: defined as the progressively decreased responsiveness to a drug. Occurs when the drug is used repeatedly and the body adapts to the continued presence of the drug. As a result, a larger dose of the drug is needed to achieve the effect originally obtained by a smaller dose. It is thought to be due to the formation of excess opioid receptors.  Drug Holiday: is when a patient stops taking a medication(s) for a period of time; anywhere from a few days to several weeks.  Withdrawals: refers to the wide range of symptoms that occur after stopping or dramatically reducing opiate drugs after heavy and prolonged use. Withdrawal symptoms do not occur to patients that use low dose opioids, or those who take the medication sporadically. Contrary to benzodiazepine (example: Valium, Xanax, etc.) or alcohol withdrawals (Delirium Tremens), opioid withdrawals are not lethal. Withdrawals are the physical manifestation of the body getting rid of the excess receptors.  Purpose To eliminate tolerance.  Duration of Holiday 14 consecutive days. (2 weeks)  Expected Symptoms Early symptoms of withdrawal include:  Agitation  Anxiety  Muscle aches  Increased tearing  Insomnia  Runny nose  Sweating  Yawning  Late symptoms of withdrawal include:  Abdominal cramping  Diarrhea  Dilated pupils  Goose bumps  Nausea  Vomiting  Opioid withdrawal reactions are very uncomfortable but are not life-threatening. Symptoms usually start within 12 hours of last opioid dose and within 30 hours of last methadone exposure.  Duration of Symptoms 48 to 72 hours for short acting medications  and 2 to 14 days for methadone.  Treatment  Clonidine (Catapres) or tizanidine  (Zanaflex) for agitation, sweating, tearing, runny nose.  Promethazine (Phenergan) for nausea, vomiting.  NSAIDs for pain.  Benefits  Improved effectiveness of opioids.  Decreased opioid dose needed to achieve benefits.  Improved pain with lesser dose. ____________________________________________________________________________________________

## 2017-01-01 DIAGNOSIS — M5134 Other intervertebral disc degeneration, thoracic region: Secondary | ICD-10-CM | POA: Insufficient documentation

## 2017-01-01 DIAGNOSIS — Z87442 Personal history of urinary calculi: Secondary | ICD-10-CM | POA: Insufficient documentation

## 2017-01-01 DIAGNOSIS — M5030 Other cervical disc degeneration, unspecified cervical region: Secondary | ICD-10-CM | POA: Insufficient documentation

## 2017-01-01 DIAGNOSIS — F1491 Cocaine use, unspecified, in remission: Secondary | ICD-10-CM | POA: Insufficient documentation

## 2017-01-01 DIAGNOSIS — M546 Pain in thoracic spine: Secondary | ICD-10-CM

## 2017-01-01 DIAGNOSIS — Z650 Conviction in civil and criminal proceedings without imprisonment: Secondary | ICD-10-CM | POA: Insufficient documentation

## 2017-01-01 DIAGNOSIS — M47812 Spondylosis without myelopathy or radiculopathy, cervical region: Secondary | ICD-10-CM | POA: Insufficient documentation

## 2017-01-01 DIAGNOSIS — F1291 Cannabis use, unspecified, in remission: Secondary | ICD-10-CM | POA: Insufficient documentation

## 2017-01-01 DIAGNOSIS — M7581 Other shoulder lesions, right shoulder: Secondary | ICD-10-CM

## 2017-01-01 DIAGNOSIS — M778 Other enthesopathies, not elsewhere classified: Secondary | ICD-10-CM | POA: Insufficient documentation

## 2017-01-01 DIAGNOSIS — G8929 Other chronic pain: Secondary | ICD-10-CM | POA: Insufficient documentation

## 2017-01-01 DIAGNOSIS — Z87898 Personal history of other specified conditions: Secondary | ICD-10-CM | POA: Insufficient documentation

## 2017-01-01 DIAGNOSIS — M47816 Spondylosis without myelopathy or radiculopathy, lumbar region: Secondary | ICD-10-CM | POA: Insufficient documentation

## 2017-01-01 NOTE — Progress Notes (Signed)
Results were reviewed and found to be: mildly abnormal  No acute injury or pathology identified  Review would suggest interventional pain management techniques may be of benefit 

## 2017-01-02 LAB — COMPLIANCE DRUG ANALYSIS, UR

## 2017-01-03 LAB — COMPREHENSIVE METABOLIC PANEL
ALT: 30 IU/L (ref 0–44)
AST: 26 IU/L (ref 0–40)
Albumin/Globulin Ratio: 1.4 (ref 1.2–2.2)
Albumin: 4.1 g/dL (ref 3.5–5.5)
Alkaline Phosphatase: 57 IU/L (ref 39–117)
BUN/Creatinine Ratio: 17 (ref 9–20)
BUN: 13 mg/dL (ref 6–24)
Bilirubin Total: 0.2 mg/dL (ref 0.0–1.2)
CALCIUM: 9.4 mg/dL (ref 8.7–10.2)
CO2: 22 mmol/L (ref 18–29)
CREATININE: 0.77 mg/dL (ref 0.76–1.27)
Chloride: 97 mmol/L (ref 96–106)
GFR calc Af Amer: 128 mL/min/{1.73_m2} (ref >59)
GFR calc non Af Amer: 111 mL/min/{1.73_m2} (ref >59)
Globulin, Total: 3 g/dL (ref 1.5–4.5)
Glucose: 94 mg/dL (ref 65–99)
Potassium: 4.6 mmol/L (ref 3.5–5.2)
Sodium: 135 mmol/L (ref 134–144)
Total Protein: 7.1 g/dL (ref 6.0–8.5)

## 2017-01-03 LAB — 25-HYDROXY VITAMIN D LCMS D2+D3
25-Hydroxy, Vitamin D-2: <1 ng/mL
25-Hydroxy, Vitamin D: 32 ng/mL

## 2017-01-03 LAB — 25-HYDROXYVITAMIN D LCMS D2+D3: 25-HYDROXY, VITAMIN D-3: 32 ng/mL

## 2017-01-03 LAB — VITAMIN B12: VITAMIN B 12: 707 pg/mL (ref 232–1245)

## 2017-01-03 LAB — C-REACTIVE PROTEIN: CRP: 3.8 mg/L (ref 0.0–4.9)

## 2017-01-03 LAB — MAGNESIUM: MAGNESIUM: 2 mg/dL (ref 1.6–2.3)

## 2017-01-03 LAB — SEDIMENTATION RATE: SED RATE: 14 mm/h (ref 0–15)

## 2017-01-14 ENCOUNTER — Ambulatory Visit: Admission: RE | Admit: 2017-01-14 | Payer: Medicare Other | Source: Ambulatory Visit

## 2017-01-23 ENCOUNTER — Encounter: Payer: Self-pay | Admitting: Nurse Practitioner

## 2017-01-23 DIAGNOSIS — F1490 Cocaine use, unspecified, uncomplicated: Secondary | ICD-10-CM | POA: Insufficient documentation

## 2017-03-18 DIAGNOSIS — F250 Schizoaffective disorder, bipolar type: Secondary | ICD-10-CM | POA: Diagnosis not present

## 2017-04-18 ENCOUNTER — Other Ambulatory Visit: Payer: Self-pay | Admitting: Nurse Practitioner

## 2017-04-18 DIAGNOSIS — B182 Chronic viral hepatitis C: Secondary | ICD-10-CM

## 2017-04-18 DIAGNOSIS — K7460 Unspecified cirrhosis of liver: Principal | ICD-10-CM

## 2017-05-19 DIAGNOSIS — Z23 Encounter for immunization: Secondary | ICD-10-CM | POA: Diagnosis not present

## 2017-05-29 DIAGNOSIS — F419 Anxiety disorder, unspecified: Secondary | ICD-10-CM | POA: Diagnosis not present

## 2017-05-29 DIAGNOSIS — B182 Chronic viral hepatitis C: Secondary | ICD-10-CM | POA: Diagnosis not present

## 2017-05-29 DIAGNOSIS — G894 Chronic pain syndrome: Secondary | ICD-10-CM | POA: Diagnosis not present

## 2017-05-29 DIAGNOSIS — Z6839 Body mass index (BMI) 39.0-39.9, adult: Secondary | ICD-10-CM | POA: Diagnosis not present

## 2017-05-29 DIAGNOSIS — F330 Major depressive disorder, recurrent, mild: Secondary | ICD-10-CM | POA: Diagnosis not present

## 2017-05-29 DIAGNOSIS — J449 Chronic obstructive pulmonary disease, unspecified: Secondary | ICD-10-CM | POA: Diagnosis not present

## 2017-05-29 DIAGNOSIS — G4089 Other seizures: Secondary | ICD-10-CM | POA: Diagnosis not present

## 2017-05-29 DIAGNOSIS — F17200 Nicotine dependence, unspecified, uncomplicated: Secondary | ICD-10-CM | POA: Diagnosis not present

## 2017-06-17 DIAGNOSIS — F250 Schizoaffective disorder, bipolar type: Secondary | ICD-10-CM | POA: Diagnosis not present

## 2017-06-23 DIAGNOSIS — R7301 Impaired fasting glucose: Secondary | ICD-10-CM | POA: Diagnosis not present

## 2017-06-23 DIAGNOSIS — R202 Paresthesia of skin: Secondary | ICD-10-CM | POA: Diagnosis not present

## 2017-06-23 DIAGNOSIS — F419 Anxiety disorder, unspecified: Secondary | ICD-10-CM | POA: Diagnosis not present

## 2017-06-23 DIAGNOSIS — J449 Chronic obstructive pulmonary disease, unspecified: Secondary | ICD-10-CM | POA: Diagnosis not present

## 2017-06-23 DIAGNOSIS — G894 Chronic pain syndrome: Secondary | ICD-10-CM | POA: Diagnosis not present

## 2017-06-23 DIAGNOSIS — F330 Major depressive disorder, recurrent, mild: Secondary | ICD-10-CM | POA: Diagnosis not present

## 2017-06-23 DIAGNOSIS — B182 Chronic viral hepatitis C: Secondary | ICD-10-CM | POA: Diagnosis not present

## 2017-06-23 DIAGNOSIS — R5383 Other fatigue: Secondary | ICD-10-CM | POA: Diagnosis not present

## 2017-06-23 DIAGNOSIS — E782 Mixed hyperlipidemia: Secondary | ICD-10-CM | POA: Diagnosis not present

## 2017-06-23 DIAGNOSIS — G4089 Other seizures: Secondary | ICD-10-CM | POA: Diagnosis not present

## 2017-06-23 DIAGNOSIS — F17200 Nicotine dependence, unspecified, uncomplicated: Secondary | ICD-10-CM | POA: Diagnosis not present

## 2017-06-25 DIAGNOSIS — J449 Chronic obstructive pulmonary disease, unspecified: Secondary | ICD-10-CM | POA: Diagnosis not present

## 2017-06-25 DIAGNOSIS — Z6839 Body mass index (BMI) 39.0-39.9, adult: Secondary | ICD-10-CM | POA: Diagnosis not present

## 2017-06-25 DIAGNOSIS — F330 Major depressive disorder, recurrent, mild: Secondary | ICD-10-CM | POA: Diagnosis not present

## 2017-06-25 DIAGNOSIS — G4089 Other seizures: Secondary | ICD-10-CM | POA: Diagnosis not present

## 2017-06-25 DIAGNOSIS — Z0001 Encounter for general adult medical examination with abnormal findings: Secondary | ICD-10-CM | POA: Diagnosis not present

## 2017-06-25 DIAGNOSIS — F419 Anxiety disorder, unspecified: Secondary | ICD-10-CM | POA: Diagnosis not present

## 2017-06-25 DIAGNOSIS — R7301 Impaired fasting glucose: Secondary | ICD-10-CM | POA: Diagnosis not present

## 2017-06-25 DIAGNOSIS — G894 Chronic pain syndrome: Secondary | ICD-10-CM | POA: Diagnosis not present

## 2017-06-25 DIAGNOSIS — F17200 Nicotine dependence, unspecified, uncomplicated: Secondary | ICD-10-CM | POA: Diagnosis not present

## 2017-06-25 DIAGNOSIS — E782 Mixed hyperlipidemia: Secondary | ICD-10-CM | POA: Diagnosis not present

## 2017-06-25 DIAGNOSIS — R202 Paresthesia of skin: Secondary | ICD-10-CM | POA: Diagnosis not present

## 2017-06-25 DIAGNOSIS — Z6838 Body mass index (BMI) 38.0-38.9, adult: Secondary | ICD-10-CM | POA: Diagnosis not present

## 2017-06-25 DIAGNOSIS — B182 Chronic viral hepatitis C: Secondary | ICD-10-CM | POA: Diagnosis not present

## 2017-09-16 DIAGNOSIS — F250 Schizoaffective disorder, bipolar type: Secondary | ICD-10-CM | POA: Diagnosis not present

## 2017-09-16 DIAGNOSIS — Z79899 Other long term (current) drug therapy: Secondary | ICD-10-CM | POA: Diagnosis not present

## 2017-10-16 DIAGNOSIS — Z79899 Other long term (current) drug therapy: Secondary | ICD-10-CM | POA: Diagnosis not present

## 2017-10-16 DIAGNOSIS — F250 Schizoaffective disorder, bipolar type: Secondary | ICD-10-CM | POA: Diagnosis not present

## 2017-11-25 DIAGNOSIS — F250 Schizoaffective disorder, bipolar type: Secondary | ICD-10-CM | POA: Diagnosis not present

## 2017-12-12 DIAGNOSIS — I10 Essential (primary) hypertension: Secondary | ICD-10-CM | POA: Diagnosis not present

## 2017-12-12 DIAGNOSIS — E782 Mixed hyperlipidemia: Secondary | ICD-10-CM | POA: Diagnosis not present

## 2017-12-12 DIAGNOSIS — R7301 Impaired fasting glucose: Secondary | ICD-10-CM | POA: Diagnosis not present

## 2017-12-17 DIAGNOSIS — J449 Chronic obstructive pulmonary disease, unspecified: Secondary | ICD-10-CM | POA: Diagnosis not present

## 2017-12-17 DIAGNOSIS — F17200 Nicotine dependence, unspecified, uncomplicated: Secondary | ICD-10-CM | POA: Diagnosis not present

## 2017-12-17 DIAGNOSIS — R7301 Impaired fasting glucose: Secondary | ICD-10-CM | POA: Diagnosis not present

## 2017-12-17 DIAGNOSIS — B182 Chronic viral hepatitis C: Secondary | ICD-10-CM | POA: Diagnosis not present

## 2017-12-17 DIAGNOSIS — G894 Chronic pain syndrome: Secondary | ICD-10-CM | POA: Diagnosis not present

## 2017-12-17 DIAGNOSIS — F419 Anxiety disorder, unspecified: Secondary | ICD-10-CM | POA: Diagnosis not present

## 2017-12-17 DIAGNOSIS — Z6837 Body mass index (BMI) 37.0-37.9, adult: Secondary | ICD-10-CM | POA: Diagnosis not present

## 2017-12-17 DIAGNOSIS — R202 Paresthesia of skin: Secondary | ICD-10-CM | POA: Diagnosis not present

## 2017-12-17 DIAGNOSIS — E782 Mixed hyperlipidemia: Secondary | ICD-10-CM | POA: Diagnosis not present

## 2017-12-17 DIAGNOSIS — F330 Major depressive disorder, recurrent, mild: Secondary | ICD-10-CM | POA: Diagnosis not present

## 2017-12-17 DIAGNOSIS — G4089 Other seizures: Secondary | ICD-10-CM | POA: Diagnosis not present

## 2018-01-06 ENCOUNTER — Encounter (INDEPENDENT_AMBULATORY_CARE_PROVIDER_SITE_OTHER): Payer: Self-pay | Admitting: Internal Medicine

## 2018-01-06 ENCOUNTER — Encounter (INDEPENDENT_AMBULATORY_CARE_PROVIDER_SITE_OTHER): Payer: Medicare Other | Admitting: Internal Medicine

## 2018-01-08 DIAGNOSIS — Z79891 Long term (current) use of opiate analgesic: Secondary | ICD-10-CM | POA: Diagnosis not present

## 2018-01-08 DIAGNOSIS — F250 Schizoaffective disorder, bipolar type: Secondary | ICD-10-CM | POA: Diagnosis not present

## 2018-01-08 IMAGING — US US ABDOMEN COMPLETE W/ ELASTOGRAPHY
1 series · 13 of 25 positions shown · non-contrast
Comparison: 07/10/2016

CLINICAL DATA: Chronic hepatitis-C without hepatic coma.



[Series 1: us abdomen complete w/ elastography · 0.24mm/px · 13 of 99 slices shown]
[im 1/99]
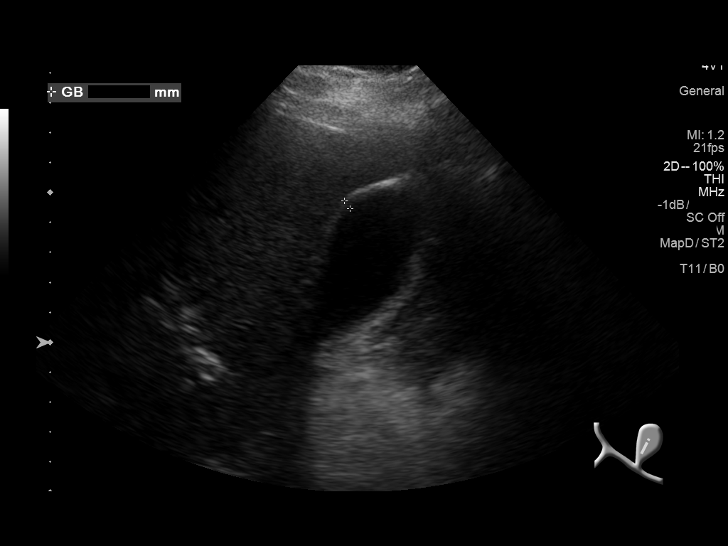
[im 9/99]
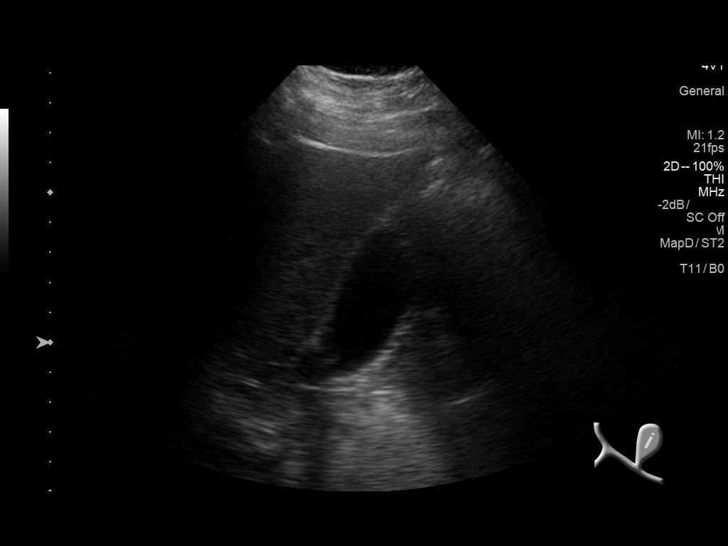
[im 17/99]
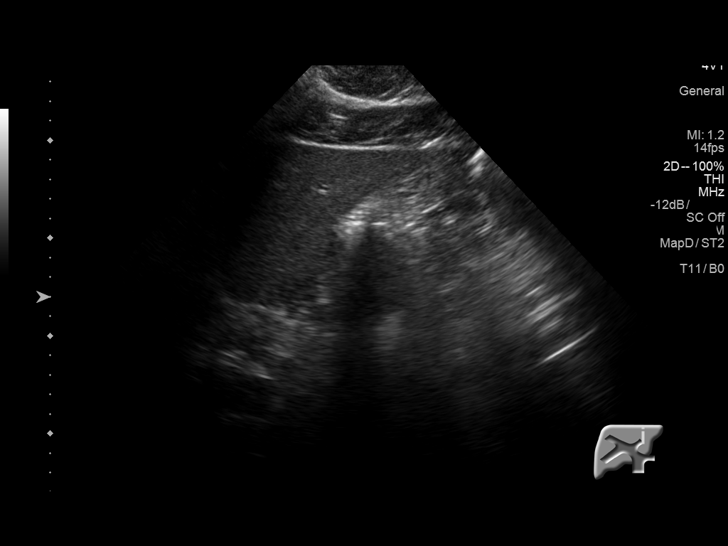
[im 25/99]
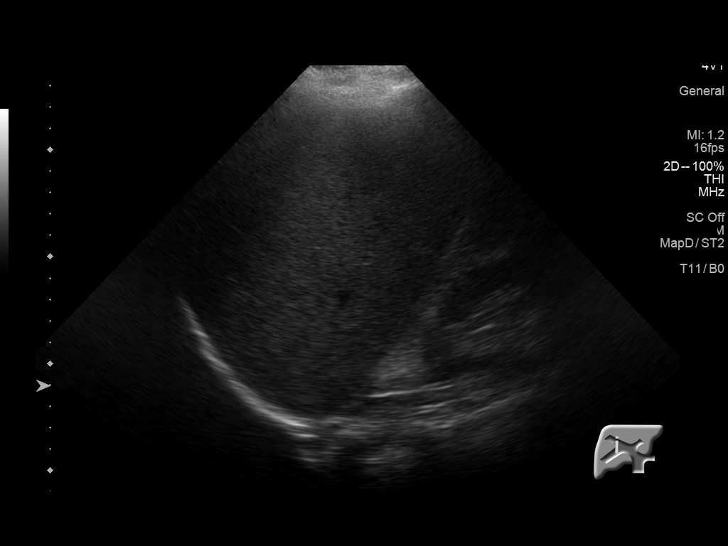
[im 33/99]
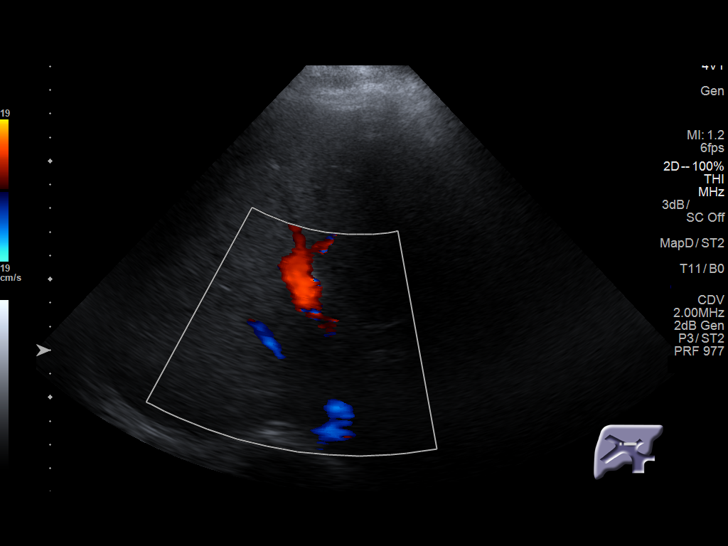
[im 41/99]
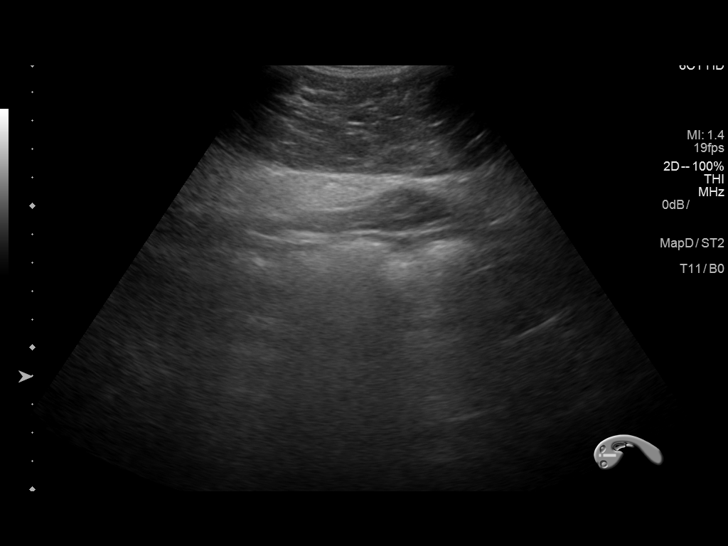
[im 50/99]
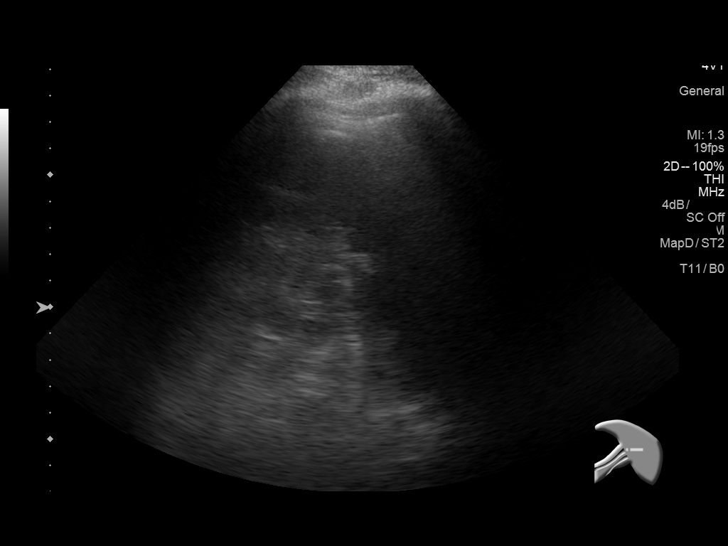
[im 58/99]
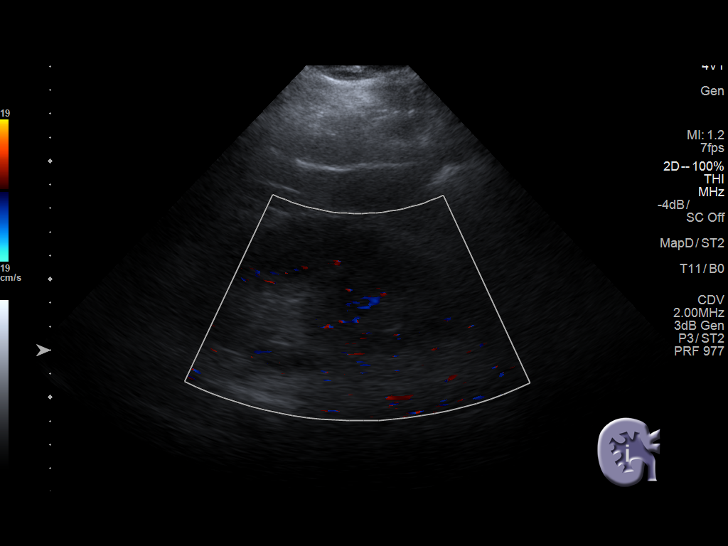
[im 66/99]
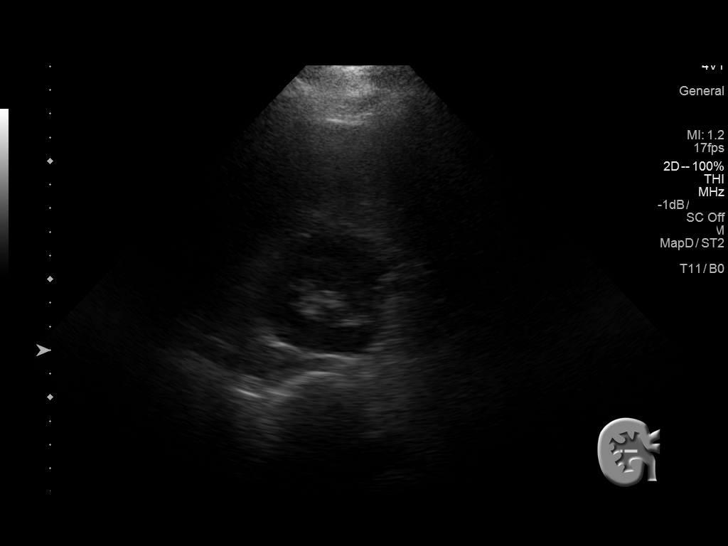
[im 74/99]
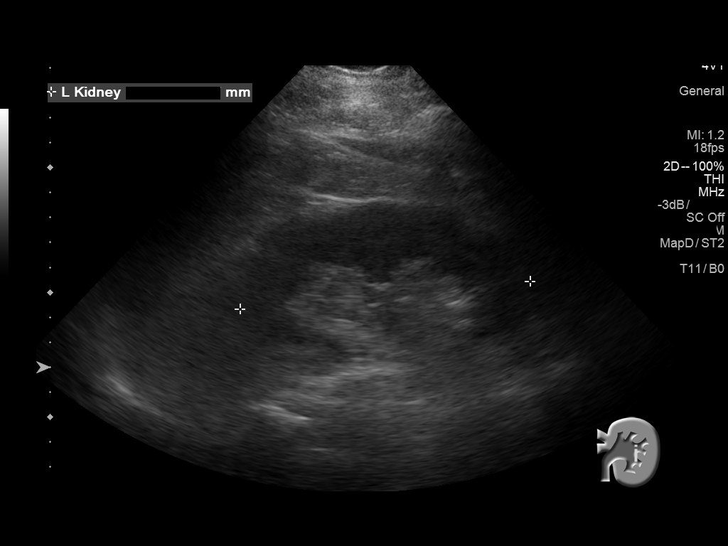
[im 82/99]
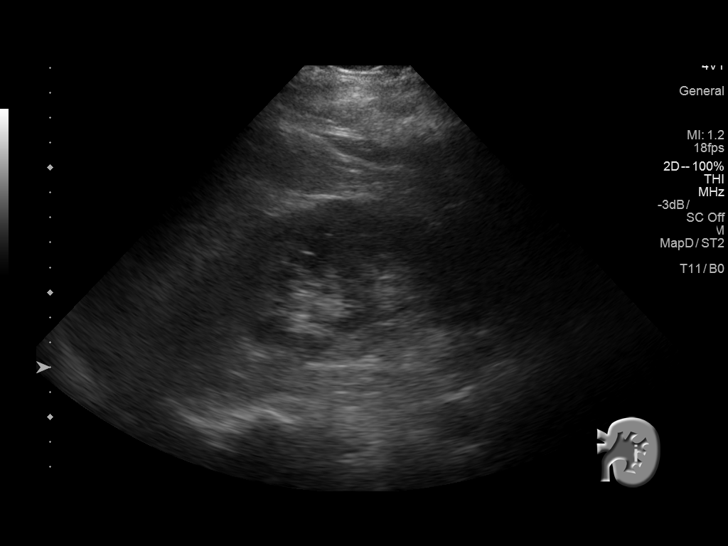
[im 90/99]
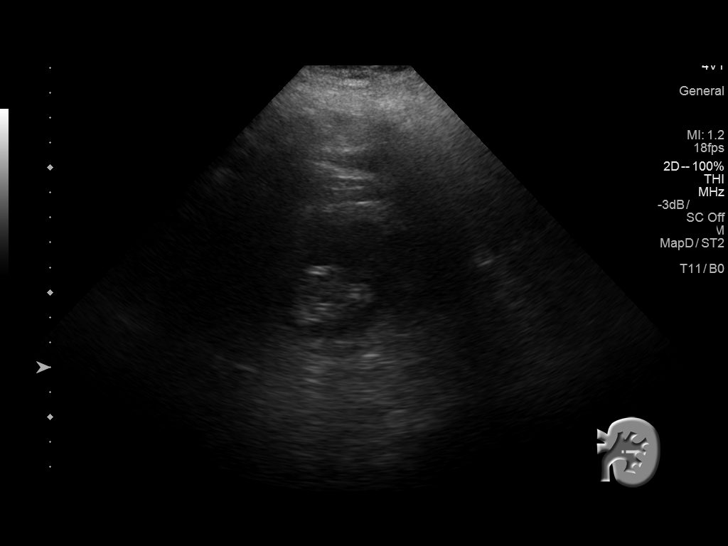
[im 99/99]
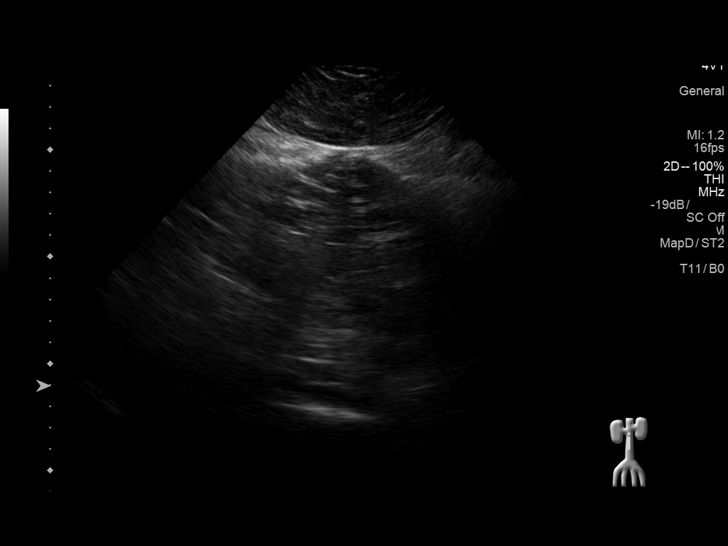

[13 of 25 positions shown; findings below may reference images not displayed]

FINDINGS: ULTRASOUND ABDOMEN

Gallbladder: No gallstones or wall thickening visualized. No
sonographic Murphy sign noted by sonographer.

Common bile duct: Diameter: 3 mm, within normal limits.

Liver: Diffusely increased parenchymal echogenicity again noted as
well as probable mild capsular nodularity, suspicious for hepatic
cirrhosis. No liver masses are identified. Portal vein is patent.

IVC: No abnormality visualized.

Pancreas: Not well visualized due to overlying bowel gas.

Spleen: Size and appearance within normal limits.

Right Kidney: Length: 11.4 cm. Echogenicity within normal limits. No
mass or hydronephrosis visualized.

Left Kidney: Length: 11.7 cm. Echogenicity within normal limits. No
mass or hydronephrosis visualized.

Abdominal aorta: No aneurysm visualized.

Other findings: None.

ULTRASOUND HEPATIC ELASTOGRAPHY

Device: Siemens Helix VTQ

Patient position: Left Lateral Decubitus

Transducer 4V1

Number of measurements: 10

Hepatic segment:  8

Median velocity:   2.16  m/sec

IQR:

IQR/Median velocity ratio:

Corresponding Metavir fibrosis score:  F2 + some F3

Risk of fibrosis: Moderate

Limitations of exam: Large body habitus

Pertinent findings noted on other imaging exams:  None

Please note that abnormal shear wave velocities may also be
identified in clinical settings other than with hepatic fibrosis,
such as: acute hepatitis, elevated right heart and central venous
pressures including use of beta blockers, Fuaad disease
([place]), infiltrative processes such as
mastocytosis/amyloidosis/infiltrative tumor, extrahepatic
cholestasis, in the post-prandial state, and liver transplantation.
Correlation with patient history, laboratory data, and clinical
condition recommended.
IMPRESSION: ULTRASOUND ABDOMEN:
Sonographic findings suspicious for hepatic cirrhosis. No liver mass
identified.

No evidence of gallstones, biliary dilatation, or other acute
findings.

ULTRASOUND HEPATIC ELASTOGRAPHY:

Median hepatic shear wave velocity is calculated at 2.16 m/sec.

Corresponding Metavir fibrosis score is  F2 + some F3.

Risk of fibrosis is Moderate.

Follow-up: Additional testing appropriate

## 2018-01-26 ENCOUNTER — Encounter (INDEPENDENT_AMBULATORY_CARE_PROVIDER_SITE_OTHER): Payer: Medicare Other | Admitting: Internal Medicine

## 2018-02-21 ENCOUNTER — Encounter (HOSPITAL_COMMUNITY): Payer: Self-pay | Admitting: Emergency Medicine

## 2018-02-21 ENCOUNTER — Emergency Department (HOSPITAL_COMMUNITY): Payer: Medicare Other

## 2018-02-21 ENCOUNTER — Other Ambulatory Visit: Payer: Self-pay

## 2018-02-21 ENCOUNTER — Emergency Department (HOSPITAL_COMMUNITY)
Admission: EM | Admit: 2018-02-21 | Discharge: 2018-02-21 | Disposition: A | Discharge status: Home / Self Care | Payer: Medicare Other | Attending: Emergency Medicine | Admitting: Emergency Medicine

## 2018-02-21 DIAGNOSIS — W268XXA Contact with other sharp object(s), not elsewhere classified, initial encounter: Secondary | ICD-10-CM | POA: Diagnosis not present

## 2018-02-21 DIAGNOSIS — Y929 Unspecified place or not applicable: Secondary | ICD-10-CM | POA: Diagnosis not present

## 2018-02-21 DIAGNOSIS — Y939 Activity, unspecified: Secondary | ICD-10-CM | POA: Diagnosis not present

## 2018-02-21 DIAGNOSIS — S61210A Laceration without foreign body of right index finger without damage to nail, initial encounter: Secondary | ICD-10-CM | POA: Diagnosis not present

## 2018-02-21 DIAGNOSIS — Y999 Unspecified external cause status: Secondary | ICD-10-CM | POA: Diagnosis not present

## 2018-02-21 DIAGNOSIS — Z79899 Other long term (current) drug therapy: Secondary | ICD-10-CM | POA: Insufficient documentation

## 2018-02-21 DIAGNOSIS — I10 Essential (primary) hypertension: Secondary | ICD-10-CM | POA: Diagnosis not present

## 2018-02-21 DIAGNOSIS — F17210 Nicotine dependence, cigarettes, uncomplicated: Secondary | ICD-10-CM | POA: Insufficient documentation

## 2018-02-21 MED ORDER — LIDOCAINE HCL (PF) 1 % IJ SOLN
INTRAMUSCULAR | Status: AC
  Filled 2018-02-21: qty 5

## 2018-02-21 MED ORDER — POVIDONE-IODINE 10 % EX SOLN
CUTANEOUS | Status: AC
  Filled 2018-02-21: qty 15

## 2018-02-21 MED ORDER — LIDOCAINE HCL (PF) 2 % IJ SOLN
5.0000 mL | Freq: Once | INTRAMUSCULAR | Status: DC
  Filled 2018-02-21: qty 10
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
## 2018-02-21 NOTE — Discharge Instructions (Addendum)
Keep the wound clean and bandaged.  Clean the area with mild soap and water.  Sutures out in 8 to 10 days.  Return here in 2 days (Monday) for recheck.  Try to keep your hand elevated to prevent swelling and throbbing.

## 2018-02-21 NOTE — ED Triage Notes (Signed)
Patient states he cut his right index finger on a can today. States last tetanus shot unknown.
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
## 2018-03-19 DIAGNOSIS — Z79891 Long term (current) use of opiate analgesic: Secondary | ICD-10-CM | POA: Diagnosis not present

## 2018-04-08 DIAGNOSIS — Z79891 Long term (current) use of opiate analgesic: Secondary | ICD-10-CM | POA: Diagnosis not present

## 2018-04-08 DIAGNOSIS — R41840 Attention and concentration deficit: Secondary | ICD-10-CM | POA: Diagnosis not present

## 2018-04-08 DIAGNOSIS — F250 Schizoaffective disorder, bipolar type: Secondary | ICD-10-CM | POA: Diagnosis not present

## 2018-05-04 DIAGNOSIS — G894 Chronic pain syndrome: Secondary | ICD-10-CM | POA: Diagnosis not present

## 2018-05-04 DIAGNOSIS — J449 Chronic obstructive pulmonary disease, unspecified: Secondary | ICD-10-CM | POA: Diagnosis not present

## 2018-05-04 DIAGNOSIS — F17200 Nicotine dependence, unspecified, uncomplicated: Secondary | ICD-10-CM | POA: Diagnosis not present

## 2018-05-04 DIAGNOSIS — G4089 Other seizures: Secondary | ICD-10-CM | POA: Diagnosis not present

## 2018-05-04 DIAGNOSIS — E039 Hypothyroidism, unspecified: Secondary | ICD-10-CM | POA: Diagnosis not present

## 2018-05-04 DIAGNOSIS — E782 Mixed hyperlipidemia: Secondary | ICD-10-CM | POA: Diagnosis not present

## 2018-05-04 DIAGNOSIS — R7301 Impaired fasting glucose: Secondary | ICD-10-CM | POA: Diagnosis not present

## 2018-05-04 DIAGNOSIS — B182 Chronic viral hepatitis C: Secondary | ICD-10-CM | POA: Diagnosis not present

## 2018-05-04 DIAGNOSIS — F319 Bipolar disorder, unspecified: Secondary | ICD-10-CM | POA: Diagnosis not present

## 2018-05-04 DIAGNOSIS — F419 Anxiety disorder, unspecified: Secondary | ICD-10-CM | POA: Diagnosis not present

## 2018-05-04 DIAGNOSIS — R202 Paresthesia of skin: Secondary | ICD-10-CM | POA: Diagnosis not present

## 2018-05-04 DIAGNOSIS — Z23 Encounter for immunization: Secondary | ICD-10-CM | POA: Diagnosis not present

## 2018-06-01 DIAGNOSIS — Z961 Presence of intraocular lens: Secondary | ICD-10-CM | POA: Diagnosis not present

## 2018-07-25 ENCOUNTER — Emergency Department (HOSPITAL_COMMUNITY): Payer: Medicare Other | Admitting: Anesthesiology

## 2018-07-25 ENCOUNTER — Observation Stay (HOSPITAL_COMMUNITY)
Admission: EM | Admit: 2018-07-25 | Discharge: 2018-07-26 | Disposition: A | Discharge status: Home / Self Care | Payer: Medicare Other | Attending: Otolaryngology | Admitting: Otolaryngology

## 2018-07-25 ENCOUNTER — Emergency Department (HOSPITAL_COMMUNITY): Payer: Medicare Other

## 2018-07-25 ENCOUNTER — Encounter (HOSPITAL_COMMUNITY): Payer: Self-pay | Admitting: Emergency Medicine

## 2018-07-25 ENCOUNTER — Encounter (HOSPITAL_COMMUNITY)
Admission: EM | Disposition: A | Discharge status: Home / Self Care | Payer: Self-pay | Source: Home / Self Care | Attending: Emergency Medicine

## 2018-07-25 DIAGNOSIS — R58 Hemorrhage, not elsewhere classified: Secondary | ICD-10-CM | POA: Diagnosis not present

## 2018-07-25 DIAGNOSIS — I10 Essential (primary) hypertension: Secondary | ICD-10-CM | POA: Diagnosis not present

## 2018-07-25 DIAGNOSIS — Z880 Allergy status to penicillin: Secondary | ICD-10-CM | POA: Diagnosis not present

## 2018-07-25 DIAGNOSIS — S1191XA Laceration without foreign body of unspecified part of neck, initial encounter: Secondary | ICD-10-CM | POA: Diagnosis not present

## 2018-07-25 DIAGNOSIS — F17200 Nicotine dependence, unspecified, uncomplicated: Secondary | ICD-10-CM | POA: Insufficient documentation

## 2018-07-25 DIAGNOSIS — S1189XA Other open wound of other specified part of neck, initial encounter: Secondary | ICD-10-CM | POA: Diagnosis not present

## 2018-07-25 DIAGNOSIS — S1181XA Laceration without foreign body of other specified part of neck, initial encounter: Secondary | ICD-10-CM | POA: Diagnosis not present

## 2018-07-25 DIAGNOSIS — K219 Gastro-esophageal reflux disease without esophagitis: Secondary | ICD-10-CM | POA: Diagnosis not present

## 2018-07-25 DIAGNOSIS — S1193XA Puncture wound without foreign body of unspecified part of neck, initial encounter: Secondary | ICD-10-CM | POA: Diagnosis present

## 2018-07-25 DIAGNOSIS — Z79899 Other long term (current) drug therapy: Secondary | ICD-10-CM | POA: Insufficient documentation

## 2018-07-25 HISTORY — PX: WOUND EXPLORATION: SHX6188

## 2018-07-25 LAB — I-STAT CHEM 8, ED
BUN: 15 mg/dL (ref 6–20)
CREATININE: 1.1 mg/dL (ref 0.61–1.24)
Calcium, Ion: 1.05 mmol/L — ABNORMAL LOW (ref 1.15–1.40)
Chloride: 106 mmol/L (ref 98–111)
Glucose, Bld: 111 mg/dL — ABNORMAL HIGH (ref 70–99)
HCT: 40 % (ref 39.0–52.0)
Hemoglobin: 13.6 g/dL (ref 13.0–17.0)
Potassium: 3.6 mmol/L (ref 3.5–5.1)
Sodium: 139 mmol/L (ref 135–145)
TCO2: 21 mmol/L — ABNORMAL LOW (ref 22–32)

## 2018-07-25 LAB — COMPREHENSIVE METABOLIC PANEL
ALT: 48 U/L — ABNORMAL HIGH (ref 0–44)
AST: 28 U/L (ref 15–41)
Albumin: 3.5 g/dL (ref 3.5–5.0)
Alkaline Phosphatase: 43 U/L (ref 38–126)
Anion gap: 11 (ref 5–15)
BUN: 14 mg/dL (ref 6–20)
CO2: 21 mmol/L — ABNORMAL LOW (ref 22–32)
Calcium: 8.6 mg/dL — ABNORMAL LOW (ref 8.9–10.3)
Chloride: 106 mmol/L (ref 98–111)
Creatinine, Ser: 1.16 mg/dL (ref 0.61–1.24)
GFR calc Af Amer: >60 mL/min (ref >60)
Glucose, Bld: 117 mg/dL — ABNORMAL HIGH (ref 70–99)
Potassium: 3.6 mmol/L (ref 3.5–5.1)
Sodium: 138 mmol/L (ref 135–145)
TOTAL PROTEIN: 6.9 g/dL (ref 6.5–8.1)
Total Bilirubin: 0.5 mg/dL (ref 0.3–1.2)

## 2018-07-25 LAB — I-STAT CG4 LACTIC ACID, ED: Lactic Acid, Venous: 2.62 mmol/L (ref 0.5–1.9)

## 2018-07-25 LAB — CBC
HCT: 41.1 % (ref 39.0–52.0)
Hemoglobin: 12.9 g/dL — ABNORMAL LOW (ref 13.0–17.0)
MCH: 25.9 pg — ABNORMAL LOW (ref 26.0–34.0)
MCHC: 31.4 g/dL (ref 30.0–36.0)
MCV: 82.4 fL (ref 80.0–100.0)
Platelets: 254 10*3/uL (ref 150–400)
RBC: 4.99 MIL/uL (ref 4.22–5.81)
RDW: 14 % (ref 11.5–15.5)
WBC: 7.9 10*3/uL (ref 4.0–10.5)
nRBC: 0 % (ref 0.0–0.2)

## 2018-07-25 LAB — TYPE AND SCREEN
ABO/RH(D): A POS
Antibody Screen: NEGATIVE
Unit division: 0
Unit division: 0

## 2018-07-25 LAB — BPAM RBC
Blood Product Expiration Date: 202001232359
Blood Product Expiration Date: 202001232359
ISSUE DATE / TIME: 201912282056
ISSUE DATE / TIME: 201912282056
UNIT TYPE AND RH: 5100
Unit Type and Rh: 5100

## 2018-07-25 LAB — CDS SEROLOGY

## 2018-07-25 LAB — PROTIME-INR
INR: 1.07
Prothrombin Time: 13.8 seconds (ref 11.4–15.2)

## 2018-07-25 LAB — ETHANOL: Alcohol, Ethyl (B): <10 mg/dL (ref <10)

## 2018-07-25 SURGERY — WOUND EXPLORATION
Anesthesia: General

## 2018-07-25 MED ORDER — FENTANYL CITRATE (PF) 100 MCG/2ML IJ SOLN
INTRAMUSCULAR | Status: DC | PRN
  Administered 2018-07-25: 125 ug via INTRAVENOUS

## 2018-07-25 MED ORDER — PROPOFOL 10 MG/ML IV BOLUS
INTRAVENOUS | Status: DC | PRN
  Administered 2018-07-25: 150 mg via INTRAVENOUS

## 2018-07-25 MED ORDER — LIDOCAINE HCL (CARDIAC) PF 100 MG/5ML IV SOSY
PREFILLED_SYRINGE | INTRAVENOUS | Status: DC | PRN
  Administered 2018-07-25: 50 mg via INTRATRACHEAL

## 2018-07-25 MED ORDER — DEXAMETHASONE SODIUM PHOSPHATE 10 MG/ML IJ SOLN
INTRAMUSCULAR | Status: DC | PRN
  Administered 2018-07-25: 10 mg via INTRAVENOUS

## 2018-07-25 MED ORDER — ONDANSETRON HCL 4 MG/2ML IJ SOLN
INTRAMUSCULAR | Status: DC | PRN
  Administered 2018-07-25: 4 mg via INTRAVENOUS

## 2018-07-25 MED ORDER — ROCURONIUM 10MG/ML (10ML) SYRINGE FOR MEDFUSION PUMP - OPTIME
INTRAVENOUS | Status: DC | PRN
  Administered 2018-07-25: 80 mg via INTRAVENOUS

## 2018-07-25 MED ORDER — LACTATED RINGERS IV SOLN
INTRAVENOUS | Status: DC | PRN
  Administered 2018-07-25: 23:00:00 via INTRAVENOUS

## 2018-07-25 MED ORDER — MIDAZOLAM HCL 2 MG/2ML IJ SOLN
INTRAMUSCULAR | Status: DC | PRN
  Administered 2018-07-25: 2 mg via INTRAVENOUS

## 2018-07-25 MED ORDER — SUCCINYLCHOLINE CHLORIDE 20 MG/ML IJ SOLN
INTRAMUSCULAR | Status: DC | PRN
  Administered 2018-07-25: 140 mg via INTRAVENOUS

## 2018-07-25 MED ORDER — SUGAMMADEX SODIUM 500 MG/5ML IV SOLN
INTRAVENOUS | Status: AC
  Filled 2018-07-25: qty 5

## 2018-07-25 MED ORDER — FENTANYL CITRATE (PF) 250 MCG/5ML IJ SOLN
INTRAMUSCULAR | Status: AC
  Filled 2018-07-25: qty 5

## 2018-07-25 MED ORDER — DEXAMETHASONE SODIUM PHOSPHATE 10 MG/ML IJ SOLN
INTRAMUSCULAR | Status: AC
  Filled 2018-07-25: qty 1

## 2018-07-25 MED ORDER — MIDAZOLAM HCL 2 MG/2ML IJ SOLN
INTRAMUSCULAR | Status: AC
  Filled 2018-07-25: qty 2

## 2018-07-25 MED ORDER — CLINDAMYCIN PHOSPHATE 600 MG/50ML IV SOLN
600.0000 mg | Freq: Once | INTRAVENOUS | Status: AC
  Administered 2018-07-25: 600 mg via INTRAVENOUS
  Filled 2018-07-25: qty 50

## 2018-07-25 MED ORDER — ONDANSETRON HCL 4 MG/2ML IJ SOLN
INTRAMUSCULAR | Status: AC
  Filled 2018-07-25: qty 2

## 2018-07-25 MED ORDER — SUGAMMADEX SODIUM 500 MG/5ML IV SOLN
INTRAVENOUS | Status: DC | PRN
  Administered 2018-07-25: 500 mg via INTRAVENOUS

## 2018-07-25 MED ORDER — IOPAMIDOL (ISOVUE-370) INJECTION 76%
75.0000 mL | Freq: Once | INTRAVENOUS | Status: AC | PRN
  Administered 2018-07-25: 75 mL via INTRAVENOUS

## 2018-07-25 SURGICAL SUPPLY — 50 items
ATTRACTOMAT 16X20 MAGNETIC DRP (DRAPES) IMPLANT
BLADE 15 SAFETY STRL DISP (BLADE) IMPLANT
BLADE SURG 10 STRL SS (BLADE) ×3 IMPLANT
BLADE SURG 15 STRL LF DISP TIS (BLADE) IMPLANT
BLADE SURG 15 STRL SS (BLADE)
CANISTER SUCT 3000ML PPV (MISCELLANEOUS) ×3 IMPLANT
CLEANER TIP ELECTROSURG 2X2 (MISCELLANEOUS) ×3 IMPLANT
COVER SURGICAL LIGHT HANDLE (MISCELLANEOUS) ×3 IMPLANT
COVER WAND RF STERILE (DRAPES) ×3 IMPLANT
CRADLE DONUT ADULT HEAD (MISCELLANEOUS) IMPLANT
DERMABOND ADVANCED (GAUZE/BANDAGES/DRESSINGS) ×2
DERMABOND ADVANCED .7 DNX12 (GAUZE/BANDAGES/DRESSINGS) ×1 IMPLANT
DRAIN CHANNEL 15F RND FF W/TCR (WOUND CARE) IMPLANT
DRAIN HEMOVAC 1/8 X 5 (WOUND CARE) IMPLANT
DRAPE HALF SHEET 40X57 (DRAPES) IMPLANT
ELECT COATED BLADE 2.86 ST (ELECTRODE) ×3 IMPLANT
ELECT REM PT RETURN 9FT ADLT (ELECTROSURGICAL) ×3
ELECTRODE REM PT RTRN 9FT ADLT (ELECTROSURGICAL) ×1 IMPLANT
EVACUATOR SILICONE 100CC (DRAIN) ×3 IMPLANT
GAUZE 4X4 16PLY RFD (DISPOSABLE) IMPLANT
GLOVE SS BIOGEL STRL SZ 7.5 (GLOVE) ×2 IMPLANT
GLOVE SUPERSENSE BIOGEL SZ 7.5 (GLOVE) ×4
GOWN STRL REUS W/ TWL LRG LVL3 (GOWN DISPOSABLE) ×2 IMPLANT
GOWN STRL REUS W/TWL LRG LVL3 (GOWN DISPOSABLE) ×4
KIT BASIN OR (CUSTOM PROCEDURE TRAY) ×3 IMPLANT
KIT TURNOVER KIT B (KITS) ×3 IMPLANT
LOCATOR NERVE 3 VOLT (DISPOSABLE) IMPLANT
NEEDLE HYPO 25GX1X1/2 BEV (NEEDLE) IMPLANT
NS IRRIG 1000ML POUR BTL (IV SOLUTION) ×3 IMPLANT
PAD ARMBOARD 7.5X6 YLW CONV (MISCELLANEOUS) ×6 IMPLANT
PENCIL FOOT CONTROL (ELECTRODE) ×3 IMPLANT
SPECIMEN JAR MEDIUM (MISCELLANEOUS) ×3 IMPLANT
SPONGE INTESTINAL PEANUT (DISPOSABLE) IMPLANT
SPONGE LAP 18X18 X RAY DECT (DISPOSABLE) ×3 IMPLANT
STAPLER VISISTAT 35W (STAPLE) ×3 IMPLANT
SUT CHROMIC 3 0 SH 27 (SUTURE) ×9 IMPLANT
SUT CHROMIC 5 0 P 3 (SUTURE) IMPLANT
SUT ETHILON 5 0 PS 2 18 (SUTURE) IMPLANT
SUT SILK 2 0 (SUTURE) ×2
SUT SILK 2 0 SH CR/8 (SUTURE) ×3 IMPLANT
SUT SILK 2-0 18XBRD TIE 12 (SUTURE) ×1 IMPLANT
SUT SILK 4 0 (SUTURE) ×8
SUT SILK 4-0 18XBRD TIE 12 (SUTURE) ×4 IMPLANT
SUT VIC AB 3-0 FS2 27 (SUTURE) IMPLANT
SUT VIC AB 3-0 SH 18 (SUTURE) IMPLANT
TOWEL OR 17X24 6PK STRL BLUE (TOWEL DISPOSABLE) ×3 IMPLANT
TRAY ENT MC OR (CUSTOM PROCEDURE TRAY) ×3 IMPLANT
TRAY FOLEY MTR SLVR 14FR STAT (SET/KITS/TRAYS/PACK) IMPLANT
TUBE FEEDING 10FR FLEXIFLO (MISCELLANEOUS) IMPLANT
WATER STERILE IRR 1000ML POUR (IV SOLUTION) ×3 IMPLANT

## 2018-07-25 NOTE — Progress Notes (Signed)
Orthopedic Tech Progress Note Patient Details:  Philip Benton 10/22/1975 [id number]  Level 1 trauma  Patient ID: Philip Benton, male   DOB: 11/27/1975, 42 y.o.   MRN: [id number]   Philip Benton 07/25/2018, 9:20 PM

## 2018-07-25 NOTE — ED Notes (Signed)
Pt transported to CT with this RN 
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
## 2018-07-25 NOTE — ED Notes (Signed)
Dr Charm BargesButler informed of lactic acid results 2.62

## 2018-07-25 NOTE — Anesthesia Procedure Notes (Signed)
Procedure Name: Intubation Date/Time: 07/25/2018 11:17 PM Performed by: Molli HazardGordon, Jimia Gentles M, CRNA Pre-anesthesia Checklist: Patient identified, Emergency Drugs available, Suction available and Patient being monitored Patient Re-evaluated:Patient Re-evaluated prior to induction Oxygen Delivery Method: Circle System Utilized Preoxygenation: Pre-oxygenation with 100% oxygen Induction Type: IV induction Ventilation: Mask ventilation without difficulty Laryngoscope Size: Glidescope and 3 Grade View: Grade I Tube type: Oral Tube size: 7.5 mm Number of attempts: 1 Airway Equipment and Method: Stylet and Oral airway Placement Confirmation: ETT inserted through vocal cords under direct vision,  positive ETCO2 and breath sounds checked- equal and bilateral Secured at: 22 cm Tube secured with: Tape Dental Injury: Teeth and Oropharynx as per pre-operative assessment
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
## 2018-07-26 ENCOUNTER — Encounter (HOSPITAL_COMMUNITY): Payer: Self-pay | Admitting: Otolaryngology

## 2018-07-26 DIAGNOSIS — S1181XA Laceration without foreign body of other specified part of neck, initial encounter: Secondary | ICD-10-CM | POA: Diagnosis not present

## 2018-07-26 DIAGNOSIS — Z79899 Other long term (current) drug therapy: Secondary | ICD-10-CM | POA: Diagnosis not present

## 2018-07-26 DIAGNOSIS — F17200 Nicotine dependence, unspecified, uncomplicated: Secondary | ICD-10-CM | POA: Diagnosis not present

## 2018-07-26 DIAGNOSIS — Z880 Allergy status to penicillin: Secondary | ICD-10-CM | POA: Diagnosis not present

## 2018-07-26 DIAGNOSIS — S1193XA Puncture wound without foreign body of unspecified part of neck, initial encounter: Secondary | ICD-10-CM | POA: Diagnosis present

## 2018-07-26 LAB — BPAM FFP
BLOOD PRODUCT EXPIRATION DATE: 202001012359
ISSUE DATE / TIME: 201912282158
Unit Type and Rh: 6200

## 2018-07-26 LAB — PREPARE FRESH FROZEN PLASMA: Unit division: 0

## 2018-07-26 LAB — ABO/RH: ABO/RH(D): A POS

## 2018-07-26 MED ORDER — HYDROMORPHONE HCL 1 MG/ML IJ SOLN: 0.2500 mg | INTRAMUSCULAR | Status: DC | PRN

## 2018-07-26 MED ORDER — CLINDAMYCIN HCL 300 MG PO CAPS: 300.0000 mg | ORAL_CAPSULE | Freq: Three times a day (TID) | ORAL | 0 refills | Status: AC

## 2018-07-26 MED ORDER — ALPRAZOLAM 0.5 MG PO TABS
1.0000 mg | ORAL_TABLET | Freq: Three times a day (TID) | ORAL | Status: DC
  Administered 2018-07-26: 1 mg via ORAL
  Filled 2018-07-26: qty 2

## 2018-07-26 MED ORDER — ONDANSETRON HCL 4 MG/2ML IJ SOLN: 4.0000 mg | Freq: Four times a day (QID) | INTRAMUSCULAR | Status: DC | PRN

## 2018-07-26 MED ORDER — DULOXETINE HCL 30 MG PO CPEP
30.0000 mg | ORAL_CAPSULE | Freq: Every day | ORAL | Status: DC
  Administered 2018-07-26: 30 mg via ORAL
  Filled 2018-07-26: qty 1

## 2018-07-26 MED ORDER — OXYCODONE HCL 5 MG/5ML PO SOLN: 5.0000 mg | Freq: Once | ORAL | Status: DC | PRN

## 2018-07-26 MED ORDER — OXYCODONE HCL 5 MG PO TABS: 5.0000 mg | ORAL_TABLET | Freq: Once | ORAL | Status: DC | PRN

## 2018-07-26 MED ORDER — HYDROMORPHONE HCL 1 MG/ML IJ SOLN
INTRAMUSCULAR | Status: AC
  Administered 2018-07-26: 0.5 mg via INTRAVENOUS
  Filled 2018-07-26: qty 1

## 2018-07-26 MED ORDER — PROMETHAZINE HCL 25 MG/ML IJ SOLN: 6.2500 mg | INTRAMUSCULAR | Status: DC | PRN

## 2018-07-26 MED ORDER — HYDROCODONE-ACETAMINOPHEN 5-325 MG PO TABS
1.0000 | ORAL_TABLET | ORAL | Status: DC | PRN
  Administered 2018-07-26: 2 via ORAL
  Filled 2018-07-26: qty 2

## 2018-07-26 MED ORDER — HYDROMORPHONE HCL 1 MG/ML IJ SOLN
0.2500 mg | INTRAMUSCULAR | Status: DC | PRN
  Administered 2018-07-26 (×2): 0.5 mg via INTRAVENOUS

## 2018-07-26 MED ORDER — LACTATED RINGERS IV SOLN: INTRAVENOUS | Status: DC

## 2018-07-26 MED ORDER — HYDROCODONE-ACETAMINOPHEN 5-325 MG PO TABS: 1.0000 | ORAL_TABLET | ORAL | 0 refills | Status: DC | PRN

## 2018-07-26 MED ORDER — CLINDAMYCIN PHOSPHATE 600 MG/50ML IV SOLN
600.0000 mg | Freq: Four times a day (QID) | INTRAVENOUS | Status: DC
  Administered 2018-07-26 (×2): 600 mg via INTRAVENOUS
  Filled 2018-07-26 (×3): qty 50

## 2018-07-26 MED ORDER — LISINOPRIL 5 MG PO TABS
2.5000 mg | ORAL_TABLET | Freq: Every day | ORAL | Status: DC
  Administered 2018-07-26: 2.5 mg via ORAL
  Filled 2018-07-26: qty 1

## 2018-07-26 MED ORDER — MORPHINE SULFATE (PF) 2 MG/ML IV SOLN: 2.0000 mg | INTRAVENOUS | Status: DC | PRN

## 2018-07-26 MED ORDER — ONDANSETRON 4 MG PO TBDP: 4.0000 mg | ORAL_TABLET | Freq: Four times a day (QID) | ORAL | Status: DC | PRN

## 2018-07-26 MED ORDER — DEXTROSE-NACL 5-0.45 % IV SOLN
INTRAVENOUS | Status: DC
  Administered 2018-07-26: 02:00:00 via INTRAVENOUS

## 2018-07-26 MED ORDER — MEPERIDINE HCL 50 MG/ML IJ SOLN: 6.2500 mg | INTRAMUSCULAR | Status: DC | PRN
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
## 2018-07-26 NOTE — Progress Notes (Signed)
Discharge instructions, Rx's, and follow up appts explained and provided to patient and c/g verbalized understanding. Patient left floor via wheelchair accompanied by staff.  Philip Benton CompanyLynn

## 2018-07-26 NOTE — Transfer of Care (Signed)
Immediate Anesthesia Transfer of Care Note  Patient: Philip Benton  Procedure(s) Performed: NECK EXPLORATION (N/A )  Patient Location: PACU  Anesthesia Type:General  Level of Consciousness: awake, alert  and oriented  Airway & Oxygen Therapy: Patient Spontanous Breathing  Post-op Assessment: Report given to RN and Post -op Vital signs reviewed and stable  Post vital signs: Reviewed and stable  Last Vitals:  Vitals Value Taken Time  BP 134/84 07/26/2018 12:18 AM  Temp    Pulse 74 07/26/2018 12:19 AM  Resp 22 07/26/2018 12:19 AM  SpO2 98 % 07/26/2018 12:19 AM  Vitals shown include unvalidated device data.  Last Pain:  Vitals:   07/25/18 2121  TempSrc:   PainSc: 6          Complications: No apparent anesthesia complications

## 2018-07-26 NOTE — Plan of Care (Signed)
  Problem: Education: Goal: Knowledge of General Education information will improve Description: Including pain rating scale, medication(s)/side effects and non-pharmacologic comfort measures Outcome: Progressing   Problem: Health Behavior/Discharge Planning: Goal: Ability to manage health-related needs will improve Outcome: Progressing   Problem: Clinical Measurements: Goal: Will remain free from infection Outcome: Progressing   

## 2018-07-26 NOTE — Anesthesia Postprocedure Evaluation (Signed)
Anesthesia Post Note  Patient: Philip Benton  Procedure(s) Performed: NECK EXPLORATION (N/A )     Patient location during evaluation: PACU Anesthesia Type: General Level of consciousness: awake and alert Pain management: pain level controlled Vital Signs Assessment: post-procedure vital signs reviewed and stable Respiratory status: spontaneous breathing, nonlabored ventilation, respiratory function stable and patient connected to nasal cannula oxygen Cardiovascular status: blood pressure returned to baseline and stable Postop Assessment: no apparent nausea or vomiting Anesthetic complications: no    Last Vitals:  Vitals:   07/26/18 0054 07/26/18 0124  BP: 132/80 136/83  Pulse: 78 77  Resp: 17 18  Temp:  36.7 C  SpO2: 97% 97%    Last Pain:  Vitals:   07/26/18 0124  TempSrc: Oral  PainSc: 2                  Shelton SilvasKevin D Hollis
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
## 2018-07-27 ENCOUNTER — Encounter (HOSPITAL_COMMUNITY): Payer: Self-pay | Admitting: Emergency Medicine

## 2018-07-27 LAB — HIV ANTIBODY (ROUTINE TESTING W REFLEX): HIV Screen 4th Generation wRfx: NONREACTIVE

## 2018-08-27 DIAGNOSIS — F250 Schizoaffective disorder, bipolar type: Secondary | ICD-10-CM | POA: Diagnosis not present

## 2018-08-27 DIAGNOSIS — Z79891 Long term (current) use of opiate analgesic: Secondary | ICD-10-CM | POA: Diagnosis not present

## 2018-08-27 DIAGNOSIS — F3181 Bipolar II disorder: Secondary | ICD-10-CM | POA: Diagnosis not present

## 2018-09-04 DIAGNOSIS — R7301 Impaired fasting glucose: Secondary | ICD-10-CM | POA: Diagnosis not present

## 2018-09-04 DIAGNOSIS — E782 Mixed hyperlipidemia: Secondary | ICD-10-CM | POA: Diagnosis not present

## 2018-09-17 DIAGNOSIS — J449 Chronic obstructive pulmonary disease, unspecified: Secondary | ICD-10-CM | POA: Diagnosis not present

## 2018-09-17 DIAGNOSIS — G4089 Other seizures: Secondary | ICD-10-CM | POA: Diagnosis not present

## 2018-09-17 DIAGNOSIS — E782 Mixed hyperlipidemia: Secondary | ICD-10-CM | POA: Diagnosis not present

## 2018-09-17 DIAGNOSIS — F17200 Nicotine dependence, unspecified, uncomplicated: Secondary | ICD-10-CM | POA: Diagnosis not present

## 2018-09-17 DIAGNOSIS — B1920 Unspecified viral hepatitis C without hepatic coma: Secondary | ICD-10-CM | POA: Diagnosis not present

## 2018-09-17 DIAGNOSIS — G8929 Other chronic pain: Secondary | ICD-10-CM | POA: Diagnosis not present

## 2018-09-17 DIAGNOSIS — F419 Anxiety disorder, unspecified: Secondary | ICD-10-CM | POA: Diagnosis not present

## 2018-09-17 DIAGNOSIS — F319 Bipolar disorder, unspecified: Secondary | ICD-10-CM | POA: Diagnosis not present

## 2018-09-17 DIAGNOSIS — R7301 Impaired fasting glucose: Secondary | ICD-10-CM | POA: Diagnosis not present

## 2018-11-10 DIAGNOSIS — F250 Schizoaffective disorder, bipolar type: Secondary | ICD-10-CM | POA: Diagnosis not present

## 2019-02-09 DIAGNOSIS — Z79891 Long term (current) use of opiate analgesic: Secondary | ICD-10-CM | POA: Diagnosis not present

## 2019-02-09 DIAGNOSIS — F250 Schizoaffective disorder, bipolar type: Secondary | ICD-10-CM | POA: Diagnosis not present

## 2019-05-05 DIAGNOSIS — Z79891 Long term (current) use of opiate analgesic: Secondary | ICD-10-CM | POA: Diagnosis not present

## 2019-08-05 DIAGNOSIS — Z79891 Long term (current) use of opiate analgesic: Secondary | ICD-10-CM | POA: Diagnosis not present

## 2019-08-19 DIAGNOSIS — R7301 Impaired fasting glucose: Secondary | ICD-10-CM | POA: Diagnosis not present

## 2019-08-19 DIAGNOSIS — E782 Mixed hyperlipidemia: Secondary | ICD-10-CM | POA: Diagnosis not present

## 2019-09-01 DIAGNOSIS — Z79891 Long term (current) use of opiate analgesic: Secondary | ICD-10-CM | POA: Diagnosis not present

## 2019-09-02 DIAGNOSIS — E782 Mixed hyperlipidemia: Secondary | ICD-10-CM | POA: Diagnosis not present

## 2019-09-02 DIAGNOSIS — J449 Chronic obstructive pulmonary disease, unspecified: Secondary | ICD-10-CM | POA: Diagnosis not present

## 2019-09-02 DIAGNOSIS — F419 Anxiety disorder, unspecified: Secondary | ICD-10-CM | POA: Diagnosis not present

## 2019-09-02 DIAGNOSIS — B1920 Unspecified viral hepatitis C without hepatic coma: Secondary | ICD-10-CM | POA: Diagnosis not present

## 2019-09-02 DIAGNOSIS — G4089 Other seizures: Secondary | ICD-10-CM | POA: Diagnosis not present

## 2019-09-02 DIAGNOSIS — R7301 Impaired fasting glucose: Secondary | ICD-10-CM | POA: Diagnosis not present

## 2019-09-02 DIAGNOSIS — F319 Bipolar disorder, unspecified: Secondary | ICD-10-CM | POA: Diagnosis not present

## 2019-09-02 DIAGNOSIS — F17200 Nicotine dependence, unspecified, uncomplicated: Secondary | ICD-10-CM | POA: Diagnosis not present

## 2019-09-02 DIAGNOSIS — G8929 Other chronic pain: Secondary | ICD-10-CM | POA: Diagnosis not present

## 2019-09-24 ENCOUNTER — Other Ambulatory Visit: Payer: Self-pay

## 2019-09-24 ENCOUNTER — Emergency Department (HOSPITAL_COMMUNITY): Payer: Medicare Other

## 2019-09-24 ENCOUNTER — Encounter (HOSPITAL_COMMUNITY): Payer: Self-pay | Admitting: *Deleted

## 2019-09-24 ENCOUNTER — Emergency Department (HOSPITAL_COMMUNITY)
Admission: EM | Admit: 2019-09-24 | Discharge: 2019-09-24 | Disposition: A | Discharge status: Home / Self Care | Payer: Medicare Other | Attending: Emergency Medicine | Admitting: Emergency Medicine

## 2019-09-24 DIAGNOSIS — Z87891 Personal history of nicotine dependence: Secondary | ICD-10-CM | POA: Diagnosis not present

## 2019-09-24 DIAGNOSIS — Z79899 Other long term (current) drug therapy: Secondary | ICD-10-CM | POA: Insufficient documentation

## 2019-09-24 DIAGNOSIS — I10 Essential (primary) hypertension: Secondary | ICD-10-CM | POA: Diagnosis not present

## 2019-09-24 DIAGNOSIS — R1011 Right upper quadrant pain: Secondary | ICD-10-CM | POA: Insufficient documentation

## 2019-09-24 LAB — COMPREHENSIVE METABOLIC PANEL
ALT: 25 U/L (ref 0–44)
AST: 22 U/L (ref 15–41)
Albumin: 4 g/dL (ref 3.5–5.0)
Alkaline Phosphatase: 55 U/L (ref 38–126)
Anion gap: 9 (ref 5–15)
BUN: 23 mg/dL — ABNORMAL HIGH (ref 6–20)
CO2: 28 mmol/L (ref 22–32)
Calcium: 9.2 mg/dL (ref 8.9–10.3)
Chloride: 100 mmol/L (ref 98–111)
Creatinine, Ser: 0.87 mg/dL (ref 0.61–1.24)
GFR calc Af Amer: >60 mL/min (ref >60)
GFR calc non Af Amer: >60 mL/min (ref >60)
Glucose, Bld: 94 mg/dL (ref 70–99)
Potassium: 3.8 mmol/L (ref 3.5–5.1)
Sodium: 137 mmol/L (ref 135–145)
Total Bilirubin: 0.4 mg/dL (ref 0.3–1.2)
Total Protein: 7.6 g/dL (ref 6.5–8.1)

## 2019-09-24 LAB — URINALYSIS, ROUTINE W REFLEX MICROSCOPIC
Bilirubin Urine: NEGATIVE
Glucose, UA: NEGATIVE mg/dL
Hgb urine dipstick: NEGATIVE
Ketones, ur: NEGATIVE mg/dL
Leukocytes,Ua: NEGATIVE
Nitrite: NEGATIVE
Protein, ur: NEGATIVE mg/dL
Specific Gravity, Urine: 1.01 (ref 1.005–1.030)
pH: 7 (ref 5.0–8.0)

## 2019-09-24 LAB — CBC WITH DIFFERENTIAL/PLATELET
Abs Immature Granulocytes: 0.01 10*3/uL (ref 0.00–0.07)
Basophils Absolute: 0 10*3/uL (ref 0.0–0.1)
Basophils Relative: 1 %
Eosinophils Absolute: 0.4 10*3/uL (ref 0.0–0.5)
Eosinophils Relative: 8 %
HCT: 34.9 % — ABNORMAL LOW (ref 39.0–52.0)
Hemoglobin: 10.9 g/dL — ABNORMAL LOW (ref 13.0–17.0)
Immature Granulocytes: 0 %
Lymphocytes Relative: 35 %
Lymphs Abs: 1.7 10*3/uL (ref 0.7–4.0)
MCH: 26.8 pg (ref 26.0–34.0)
MCHC: 31.2 g/dL (ref 30.0–36.0)
MCV: 86 fL (ref 80.0–100.0)
Monocytes Absolute: 0.4 10*3/uL (ref 0.1–1.0)
Monocytes Relative: 9 %
Neutro Abs: 2.3 10*3/uL (ref 1.7–7.7)
Neutrophils Relative %: 47 %
Platelets: 232 10*3/uL (ref 150–400)
RBC: 4.06 MIL/uL — ABNORMAL LOW (ref 4.22–5.81)
RDW: 14 % (ref 11.5–15.5)
WBC: 4.9 10*3/uL (ref 4.0–10.5)
nRBC: 0 % (ref 0.0–0.2)

## 2019-09-24 LAB — LIPASE, BLOOD: Lipase: 66 U/L — ABNORMAL HIGH (ref 11–51)

## 2019-09-24 NOTE — ED Triage Notes (Signed)
Pt with abd pain since this morning, waking pt up from sleep.  Pt denies any N/VD or fever.

## 2019-09-24 NOTE — ED Notes (Signed)
Pt denies taking anything for pain, pt very slow to respond .
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
## 2019-09-24 NOTE — Discharge Instructions (Addendum)
Your tests were unremarkable - and show signs of chronic liver damage from your hepatitis - no other findings - your gall bladder looked normal.  ER for worsening pain  Sometimes this pain is related to what is called pancreatitis so if you continue to have pain with eating or drinking alcohol this may be pancreatitis and if your pain becomes severe you should come back to the hospital.  You should also avoid drinking alcohol or eating fatty meals as this can make things worse

## 2019-11-30 DIAGNOSIS — Z79891 Long term (current) use of opiate analgesic: Secondary | ICD-10-CM | POA: Diagnosis not present

## 2020-02-29 DIAGNOSIS — F250 Schizoaffective disorder, bipolar type: Secondary | ICD-10-CM | POA: Diagnosis not present

## 2020-02-29 DIAGNOSIS — R41840 Attention and concentration deficit: Secondary | ICD-10-CM | POA: Diagnosis not present

## 2020-02-29 DIAGNOSIS — Z79891 Long term (current) use of opiate analgesic: Secondary | ICD-10-CM | POA: Diagnosis not present

## 2020-03-02 ENCOUNTER — Encounter (INDEPENDENT_AMBULATORY_CARE_PROVIDER_SITE_OTHER): Payer: Self-pay | Admitting: Gastroenterology

## 2020-03-06 DIAGNOSIS — G8929 Other chronic pain: Secondary | ICD-10-CM | POA: Diagnosis not present

## 2020-03-06 DIAGNOSIS — R7301 Impaired fasting glucose: Secondary | ICD-10-CM | POA: Diagnosis not present

## 2020-03-06 DIAGNOSIS — F319 Bipolar disorder, unspecified: Secondary | ICD-10-CM | POA: Diagnosis not present

## 2020-03-06 DIAGNOSIS — S8012XA Contusion of left lower leg, initial encounter: Secondary | ICD-10-CM | POA: Diagnosis not present

## 2020-03-06 DIAGNOSIS — F17200 Nicotine dependence, unspecified, uncomplicated: Secondary | ICD-10-CM | POA: Diagnosis not present

## 2020-03-06 DIAGNOSIS — G4089 Other seizures: Secondary | ICD-10-CM | POA: Diagnosis not present

## 2020-03-06 DIAGNOSIS — J449 Chronic obstructive pulmonary disease, unspecified: Secondary | ICD-10-CM | POA: Diagnosis not present

## 2020-03-06 DIAGNOSIS — E782 Mixed hyperlipidemia: Secondary | ICD-10-CM | POA: Diagnosis not present

## 2020-03-06 DIAGNOSIS — F419 Anxiety disorder, unspecified: Secondary | ICD-10-CM | POA: Diagnosis not present

## 2020-03-06 DIAGNOSIS — B1920 Unspecified viral hepatitis C without hepatic coma: Secondary | ICD-10-CM | POA: Diagnosis not present

## 2020-03-16 ENCOUNTER — Ambulatory Visit (HOSPITAL_COMMUNITY): Payer: Self-pay

## 2020-03-27 DIAGNOSIS — Z23 Encounter for immunization: Secondary | ICD-10-CM | POA: Diagnosis not present

## 2020-03-29 ENCOUNTER — Ambulatory Visit (INDEPENDENT_AMBULATORY_CARE_PROVIDER_SITE_OTHER): Payer: Medicare Other | Admitting: Gastroenterology

## 2020-06-15 DIAGNOSIS — Z79891 Long term (current) use of opiate analgesic: Secondary | ICD-10-CM | POA: Diagnosis not present

## 2020-09-13 DIAGNOSIS — F250 Schizoaffective disorder, bipolar type: Secondary | ICD-10-CM | POA: Diagnosis not present

## 2020-09-13 DIAGNOSIS — Z79891 Long term (current) use of opiate analgesic: Secondary | ICD-10-CM | POA: Diagnosis not present

## 2020-12-14 ENCOUNTER — Emergency Department: Payer: 59

## 2020-12-14 ENCOUNTER — Emergency Department
Admission: EM | Admit: 2020-12-14 | Discharge: 2020-12-14 | Disposition: A | Discharge status: Home / Self Care | Payer: 59 | Attending: Emergency Medicine | Admitting: Emergency Medicine

## 2020-12-14 ENCOUNTER — Other Ambulatory Visit: Payer: Self-pay

## 2020-12-14 ENCOUNTER — Emergency Department: Admission: EM | Admit: 2020-12-14 | Discharge: 2020-12-14 | Discharge status: Absent without leave | Payer: Self-pay

## 2020-12-14 DIAGNOSIS — L03112 Cellulitis of left axilla: Secondary | ICD-10-CM | POA: Insufficient documentation

## 2020-12-14 DIAGNOSIS — F17210 Nicotine dependence, cigarettes, uncomplicated: Secondary | ICD-10-CM | POA: Insufficient documentation

## 2020-12-14 DIAGNOSIS — I10 Essential (primary) hypertension: Secondary | ICD-10-CM | POA: Diagnosis not present

## 2020-12-14 DIAGNOSIS — M25562 Pain in left knee: Secondary | ICD-10-CM | POA: Diagnosis present

## 2020-12-14 DIAGNOSIS — L03116 Cellulitis of left lower limb: Secondary | ICD-10-CM

## 2020-12-14 DIAGNOSIS — Z79899 Other long term (current) drug therapy: Secondary | ICD-10-CM | POA: Insufficient documentation

## 2020-12-14 MED ORDER — HYDROCODONE-ACETAMINOPHEN 5-325 MG PO TABS
1.0000 | ORAL_TABLET | Freq: Once | ORAL | Status: AC
  Administered 2020-12-14: 1 via ORAL
  Filled 2020-12-14: qty 1

## 2020-12-14 MED ORDER — CLINDAMYCIN PHOSPHATE 600 MG/4ML IJ SOLN
600.0000 mg | Freq: Once | INTRAMUSCULAR | Status: AC
  Administered 2020-12-14: 600 mg via INTRAMUSCULAR
  Filled 2020-12-14 (×2): qty 4

## 2020-12-14 MED ORDER — CLINDAMYCIN HCL 300 MG PO CAPS: 300.0000 mg | ORAL_CAPSULE | Freq: Three times a day (TID) | ORAL | 0 refills | Status: AC

## 2020-12-14 MED ORDER — CLINDAMYCIN PHOSPHATE 600 MG/4ML IJ SOLN: 600.0000 mg | Freq: Once | INTRAMUSCULAR | Status: DC

## 2020-12-14 NOTE — ED Triage Notes (Signed)
Pt to ED for swelling/knot to left knee since yesterday. Unsure if was bit by insect.  Heat and swelling noted to left knee.  Verbal order Dr Larinda Buttery

## 2020-12-14 NOTE — Discharge Instructions (Signed)
Follow-up with your primary care provider if any continued problems.  You may return to the emergency department if any worsening of your knee over the weekend especially if you develop any nausea, vomiting or fever.  Continue taking antibiotics as directed until completely finished.  Use warm moist compresses to the area frequently
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
## 2020-12-14 NOTE — ED Notes (Signed)
See triage note  Presents with redness and swelling to left knee  Denies any injury  Skin warm to touch

## 2020-12-14 NOTE — ED Notes (Signed)
Error in assigning acuity 3, changed to 4.

## 2021-06-19 DIAGNOSIS — Z79891 Long term (current) use of opiate analgesic: Secondary | ICD-10-CM | POA: Diagnosis not present

## 2022-12-21 ENCOUNTER — Other Ambulatory Visit: Payer: Self-pay

## 2022-12-21 DIAGNOSIS — G40409 Other generalized epilepsy and epileptic syndromes, not intractable, without status epilepticus: Secondary | ICD-10-CM | POA: Diagnosis present

## 2022-12-21 DIAGNOSIS — F17210 Nicotine dependence, cigarettes, uncomplicated: Secondary | ICD-10-CM | POA: Diagnosis present

## 2022-12-21 DIAGNOSIS — L02611 Cutaneous abscess of right foot: Secondary | ICD-10-CM | POA: Diagnosis present

## 2022-12-21 DIAGNOSIS — S91331A Puncture wound without foreign body, right foot, initial encounter: Secondary | ICD-10-CM | POA: Diagnosis present

## 2022-12-21 DIAGNOSIS — Z5329 Procedure and treatment not carried out because of patient's decision for other reasons: Secondary | ICD-10-CM | POA: Diagnosis not present

## 2022-12-21 DIAGNOSIS — Z880 Allergy status to penicillin: Secondary | ICD-10-CM

## 2022-12-21 DIAGNOSIS — K219 Gastro-esophageal reflux disease without esophagitis: Secondary | ICD-10-CM | POA: Diagnosis present

## 2022-12-21 DIAGNOSIS — Z8249 Family history of ischemic heart disease and other diseases of the circulatory system: Secondary | ICD-10-CM

## 2022-12-21 DIAGNOSIS — Z8619 Personal history of other infectious and parasitic diseases: Secondary | ICD-10-CM

## 2022-12-21 DIAGNOSIS — B957 Other staphylococcus as the cause of diseases classified elsewhere: Secondary | ICD-10-CM | POA: Diagnosis present

## 2022-12-21 DIAGNOSIS — E872 Acidosis, unspecified: Secondary | ICD-10-CM | POA: Diagnosis present

## 2022-12-21 DIAGNOSIS — Z79899 Other long term (current) drug therapy: Secondary | ICD-10-CM

## 2022-12-21 DIAGNOSIS — G894 Chronic pain syndrome: Secondary | ICD-10-CM | POA: Diagnosis present

## 2022-12-21 DIAGNOSIS — L03115 Cellulitis of right lower limb: Principal | ICD-10-CM | POA: Diagnosis present

## 2022-12-21 DIAGNOSIS — F1120 Opioid dependence, uncomplicated: Secondary | ICD-10-CM | POA: Diagnosis present

## 2022-12-21 DIAGNOSIS — M7989 Other specified soft tissue disorders: Secondary | ICD-10-CM | POA: Diagnosis not present

## 2022-12-21 DIAGNOSIS — Z23 Encounter for immunization: Secondary | ICD-10-CM

## 2022-12-21 DIAGNOSIS — X58XXXA Exposure to other specified factors, initial encounter: Secondary | ICD-10-CM | POA: Diagnosis present

## 2022-12-21 DIAGNOSIS — Z886 Allergy status to analgesic agent status: Secondary | ICD-10-CM

## 2022-12-21 DIAGNOSIS — F259 Schizoaffective disorder, unspecified: Secondary | ICD-10-CM | POA: Diagnosis present

## 2022-12-21 DIAGNOSIS — I10 Essential (primary) hypertension: Secondary | ICD-10-CM | POA: Diagnosis present

## 2022-12-21 NOTE — ED Triage Notes (Signed)
Pt to ED from home for swelling of his right foot x 1 week. Pt is CAOx4, in no acute distress. Pt has small puncture wound on bottom of foot at 3rd toe with some swelling to the third toe.

## 2022-12-22 ENCOUNTER — Emergency Department: Payer: 59

## 2022-12-22 ENCOUNTER — Encounter
Admission: EM | Discharge status: Against Medical Advice | Payer: Self-pay | Source: Home / Self Care | Attending: Student in an Organized Health Care Education/Training Program

## 2022-12-22 ENCOUNTER — Inpatient Hospital Stay: Payer: 59

## 2022-12-22 ENCOUNTER — Inpatient Hospital Stay
Admission: EM | Admit: 2022-12-22 | Discharge: 2022-12-23 | DRG: 580 | Discharge status: Against Medical Advice | Payer: 59 | Attending: Student in an Organized Health Care Education/Training Program | Admitting: Student in an Organized Health Care Education/Training Program

## 2022-12-22 DIAGNOSIS — L089 Local infection of the skin and subcutaneous tissue, unspecified: Secondary | ICD-10-CM

## 2022-12-22 DIAGNOSIS — F259 Schizoaffective disorder, unspecified: Secondary | ICD-10-CM | POA: Diagnosis present

## 2022-12-22 DIAGNOSIS — Z880 Allergy status to penicillin: Secondary | ICD-10-CM | POA: Diagnosis not present

## 2022-12-22 DIAGNOSIS — Z23 Encounter for immunization: Secondary | ICD-10-CM | POA: Diagnosis not present

## 2022-12-22 DIAGNOSIS — Z8619 Personal history of other infectious and parasitic diseases: Secondary | ICD-10-CM | POA: Diagnosis not present

## 2022-12-22 DIAGNOSIS — M7989 Other specified soft tissue disorders: Secondary | ICD-10-CM | POA: Diagnosis present

## 2022-12-22 DIAGNOSIS — K219 Gastro-esophageal reflux disease without esophagitis: Secondary | ICD-10-CM | POA: Diagnosis present

## 2022-12-22 DIAGNOSIS — Z8249 Family history of ischemic heart disease and other diseases of the circulatory system: Secondary | ICD-10-CM | POA: Diagnosis not present

## 2022-12-22 DIAGNOSIS — B957 Other staphylococcus as the cause of diseases classified elsewhere: Secondary | ICD-10-CM | POA: Diagnosis present

## 2022-12-22 DIAGNOSIS — G40409 Other generalized epilepsy and epileptic syndromes, not intractable, without status epilepticus: Secondary | ICD-10-CM | POA: Diagnosis present

## 2022-12-22 DIAGNOSIS — F1120 Opioid dependence, uncomplicated: Secondary | ICD-10-CM | POA: Diagnosis present

## 2022-12-22 DIAGNOSIS — X58XXXA Exposure to other specified factors, initial encounter: Secondary | ICD-10-CM | POA: Diagnosis present

## 2022-12-22 DIAGNOSIS — F1910 Other psychoactive substance abuse, uncomplicated: Secondary | ICD-10-CM | POA: Insufficient documentation

## 2022-12-22 DIAGNOSIS — S91331A Puncture wound without foreign body, right foot, initial encounter: Secondary | ICD-10-CM | POA: Diagnosis present

## 2022-12-22 DIAGNOSIS — G894 Chronic pain syndrome: Secondary | ICD-10-CM | POA: Diagnosis present

## 2022-12-22 DIAGNOSIS — B1920 Unspecified viral hepatitis C without hepatic coma: Secondary | ICD-10-CM | POA: Diagnosis present

## 2022-12-22 DIAGNOSIS — Z886 Allergy status to analgesic agent status: Secondary | ICD-10-CM | POA: Diagnosis not present

## 2022-12-22 DIAGNOSIS — F209 Schizophrenia, unspecified: Secondary | ICD-10-CM | POA: Diagnosis present

## 2022-12-22 DIAGNOSIS — F17210 Nicotine dependence, cigarettes, uncomplicated: Secondary | ICD-10-CM | POA: Diagnosis present

## 2022-12-22 DIAGNOSIS — Z5329 Procedure and treatment not carried out because of patient's decision for other reasons: Secondary | ICD-10-CM | POA: Diagnosis not present

## 2022-12-22 DIAGNOSIS — Z79899 Other long term (current) drug therapy: Secondary | ICD-10-CM | POA: Diagnosis not present

## 2022-12-22 DIAGNOSIS — L03115 Cellulitis of right lower limb: Secondary | ICD-10-CM | POA: Diagnosis present

## 2022-12-22 DIAGNOSIS — Z79891 Long term (current) use of opiate analgesic: Secondary | ICD-10-CM

## 2022-12-22 DIAGNOSIS — E872 Acidosis, unspecified: Secondary | ICD-10-CM | POA: Diagnosis present

## 2022-12-22 DIAGNOSIS — L02611 Cutaneous abscess of right foot: Secondary | ICD-10-CM | POA: Diagnosis present

## 2022-12-22 DIAGNOSIS — I10 Essential (primary) hypertension: Secondary | ICD-10-CM | POA: Diagnosis present

## 2022-12-22 DIAGNOSIS — G40309 Generalized idiopathic epilepsy and epileptic syndromes, not intractable, without status epilepticus: Secondary | ICD-10-CM | POA: Diagnosis present

## 2022-12-22 DIAGNOSIS — L03119 Cellulitis of unspecified part of limb: Principal | ICD-10-CM

## 2022-12-22 LAB — URINALYSIS, ROUTINE W REFLEX MICROSCOPIC
Bilirubin Urine: NEGATIVE
Glucose, UA: NEGATIVE mg/dL
Hgb urine dipstick: NEGATIVE
Ketones, ur: NEGATIVE mg/dL
Leukocytes,Ua: NEGATIVE
Nitrite: NEGATIVE
Protein, ur: NEGATIVE mg/dL
Specific Gravity, Urine: 1.001 — ABNORMAL LOW (ref 1.005–1.030)
pH: 6 (ref 5.0–8.0)

## 2022-12-22 LAB — BLOOD CULTURE ID PANEL (REFLEXED) - BCID2

## 2022-12-22 LAB — COMPREHENSIVE METABOLIC PANEL
ALT: 11 U/L (ref 0–44)
AST: 17 U/L (ref 15–41)
Albumin: 4 g/dL (ref 3.5–5.0)
Alkaline Phosphatase: 66 U/L (ref 38–126)
Anion gap: 11 (ref 5–15)
BUN: 13 mg/dL (ref 6–20)
CO2: 23 mmol/L (ref 22–32)
Calcium: 9.4 mg/dL (ref 8.9–10.3)
Chloride: 99 mmol/L (ref 98–111)
Creatinine, Ser: 1.4 mg/dL — ABNORMAL HIGH (ref 0.61–1.24)
GFR, Estimated: >60 mL/min (ref >60)
Glucose, Bld: 95 mg/dL (ref 70–99)
Potassium: 3.5 mmol/L (ref 3.5–5.1)
Sodium: 133 mmol/L — ABNORMAL LOW (ref 135–145)
Total Bilirubin: 0.4 mg/dL (ref 0.3–1.2)
Total Protein: 8.3 g/dL — ABNORMAL HIGH (ref 6.5–8.1)

## 2022-12-22 LAB — CBC WITH DIFFERENTIAL/PLATELET
Abs Immature Granulocytes: 0.04 10*3/uL (ref 0.00–0.07)
Basophils Absolute: 0.1 10*3/uL (ref 0.0–0.1)
Basophils Relative: 1 %
Eosinophils Absolute: 0.2 10*3/uL (ref 0.0–0.5)
Eosinophils Relative: 2 %
HCT: 39.6 % (ref 39.0–52.0)
Hemoglobin: 12.6 g/dL — ABNORMAL LOW (ref 13.0–17.0)
Immature Granulocytes: 0 %
Lymphocytes Relative: 18 %
Lymphs Abs: 2.4 10*3/uL (ref 0.7–4.0)
MCH: 25 pg — ABNORMAL LOW (ref 26.0–34.0)
MCHC: 31.8 g/dL (ref 30.0–36.0)
MCV: 78.7 fL — ABNORMAL LOW (ref 80.0–100.0)
Monocytes Absolute: 0.9 10*3/uL (ref 0.1–1.0)
Monocytes Relative: 7 %
Neutro Abs: 9.6 10*3/uL — ABNORMAL HIGH (ref 1.7–7.7)
Neutrophils Relative %: 72 %
Platelets: 294 10*3/uL (ref 150–400)
RBC: 5.03 MIL/uL (ref 4.22–5.81)
RDW: 15.1 % (ref 11.5–15.5)
WBC: 13.2 10*3/uL — ABNORMAL HIGH (ref 4.0–10.5)
nRBC: 0 % (ref 0.0–0.2)

## 2022-12-22 LAB — LACTIC ACID, PLASMA
Lactic Acid, Venous: 1.9 mmol/L (ref 0.5–1.9)
Lactic Acid, Venous: 2.4 mmol/L (ref 0.5–1.9)

## 2022-12-22 LAB — PROCALCITONIN: Procalcitonin: 0.14 ng/mL

## 2022-12-22 SURGERY — IRRIGATION AND DEBRIDEMENT ABSCESS
Anesthesia: Choice | Laterality: Right

## 2022-12-22 MED ORDER — HYDROCODONE-ACETAMINOPHEN 5-325 MG PO TABS
1.0000 | ORAL_TABLET | ORAL | Status: DC | PRN
  Administered 2022-12-22: 1 via ORAL
  Administered 2022-12-23 (×2): 2 via ORAL
  Filled 2022-12-22 (×2): qty 2
  Filled 2022-12-22: qty 1

## 2022-12-22 MED ORDER — VANCOMYCIN HCL 2000 MG/400ML IV SOLN: 2000.0000 mg | Freq: Once | INTRAVENOUS | Status: DC

## 2022-12-22 MED ORDER — ONDANSETRON HCL 4 MG PO TABS: 4.0000 mg | ORAL_TABLET | Freq: Four times a day (QID) | ORAL | Status: DC | PRN

## 2022-12-22 MED ORDER — VANCOMYCIN HCL 1500 MG/300ML IV SOLN
1500.0000 mg | Freq: Once | INTRAVENOUS | Status: AC
  Administered 2022-12-22: 1500 mg via INTRAVENOUS
  Filled 2022-12-22: qty 300

## 2022-12-22 MED ORDER — SODIUM CHLORIDE 0.9 % IV SOLN
2.0000 g | INTRAVENOUS | Status: DC
  Filled 2022-12-22: qty 20

## 2022-12-22 MED ORDER — DIVALPROEX SODIUM 500 MG PO DR TAB
1000.0000 mg | DELAYED_RELEASE_TABLET | Freq: Every day | ORAL | Status: DC
  Administered 2022-12-22: 1000 mg via ORAL
  Filled 2022-12-22: qty 2

## 2022-12-22 MED ORDER — ACETAMINOPHEN 325 MG PO TABS: 650.0000 mg | ORAL_TABLET | Freq: Four times a day (QID) | ORAL | Status: DC | PRN

## 2022-12-22 MED ORDER — TETANUS-DIPHTHERIA TOXOIDS TD 5-2 LFU IM INJ
0.5000 mL | INJECTION | Freq: Once | INTRAMUSCULAR | Status: AC
  Administered 2022-12-22: 0.5 mL via INTRAMUSCULAR
  Filled 2022-12-22: qty 0.5

## 2022-12-22 MED ORDER — HYDROMORPHONE HCL 1 MG/ML IJ SOLN
1.0000 mg | Freq: Once | INTRAMUSCULAR | Status: AC
  Administered 2022-12-22: 1 mg via INTRAVENOUS
  Filled 2022-12-22: qty 1

## 2022-12-22 MED ORDER — ACETAMINOPHEN 650 MG RE SUPP: 650.0000 mg | Freq: Four times a day (QID) | RECTAL | Status: DC | PRN

## 2022-12-22 MED ORDER — MORPHINE SULFATE (PF) 2 MG/ML IV SOLN
2.0000 mg | INTRAVENOUS | Status: DC | PRN
  Administered 2022-12-22 – 2022-12-23 (×6): 2 mg via INTRAVENOUS
  Filled 2022-12-22 (×6): qty 1

## 2022-12-22 MED ORDER — VANCOMYCIN HCL 1750 MG/350ML IV SOLN
1750.0000 mg | INTRAVENOUS | Status: DC
  Administered 2022-12-22: 1750 mg via INTRAVENOUS
  Filled 2022-12-22: qty 350

## 2022-12-22 MED ORDER — DIVALPROEX SODIUM 500 MG PO DR TAB
500.0000 mg | DELAYED_RELEASE_TABLET | Freq: Every morning | ORAL | Status: DC
  Administered 2022-12-22 – 2022-12-23 (×2): 500 mg via ORAL
  Filled 2022-12-22 (×2): qty 1

## 2022-12-22 MED ORDER — ONDANSETRON HCL 4 MG/2ML IJ SOLN: 4.0000 mg | Freq: Four times a day (QID) | INTRAMUSCULAR | Status: DC | PRN

## 2022-12-22 MED ORDER — ALPRAZOLAM 1 MG PO TABS
1.0000 mg | ORAL_TABLET | Freq: Once | ORAL | Status: AC
  Administered 2022-12-22: 1 mg via ORAL
  Filled 2022-12-22: qty 1

## 2022-12-22 MED ORDER — VANCOMYCIN HCL IN DEXTROSE 1-5 GM/200ML-% IV SOLN
1000.0000 mg | Freq: Once | INTRAVENOUS | Status: AC
  Administered 2022-12-22: 1000 mg via INTRAVENOUS
  Filled 2022-12-22: qty 200

## 2022-12-22 MED ORDER — ENOXAPARIN SODIUM 60 MG/0.6ML IJ SOSY
0.5000 mg/kg | PREFILLED_SYRINGE | INTRAMUSCULAR | Status: DC
  Administered 2022-12-22 – 2022-12-23 (×2): 55 mg via SUBCUTANEOUS
  Filled 2022-12-22 (×2): qty 0.6

## 2022-12-22 MED ORDER — SODIUM CHLORIDE 0.9 % IV SOLN: 1.0000 g | INTRAVENOUS | Status: DC

## 2022-12-22 MED ORDER — SODIUM CHLORIDE 0.9 % IV BOLUS
1000.0000 mL | Freq: Once | INTRAVENOUS | Status: AC
  Administered 2022-12-22: 1000 mL via INTRAVENOUS

## 2022-12-22 MED ORDER — SODIUM CHLORIDE 0.9 % IV SOLN: INTRAVENOUS | Status: AC

## 2022-12-22 MED ORDER — SODIUM CHLORIDE 0.9 % IV SOLN
2.0000 g | Freq: Once | INTRAVENOUS | Status: AC
  Administered 2022-12-22: 2 g via INTRAVENOUS
  Filled 2022-12-22: qty 20

## 2022-12-22 SURGICAL SUPPLY — 47 items
BLADE OSCILLATING/SAGITTAL (BLADE)
BLADE SURG 15 STRL LF DISP TIS (BLADE) ×1 IMPLANT
BLADE SURG 15 STRL SS (BLADE) ×1
BLADE SURG MINI STRL (BLADE) IMPLANT
BLADE SW THK.38XMED LNG THN (BLADE) IMPLANT
BNDG ELASTIC 4X5.8 VLCR NS LF (GAUZE/BANDAGES/DRESSINGS) ×1 IMPLANT
BNDG ESMARCH 4 X 12 STRL LF (GAUZE/BANDAGES/DRESSINGS) ×1
BNDG ESMARCH 4X12 STRL LF (GAUZE/BANDAGES/DRESSINGS) ×1 IMPLANT
BNDG GAUZE DERMACEA FLUFF 4 (GAUZE/BANDAGES/DRESSINGS) ×1 IMPLANT
CUFF TOURN SGL QUICK 12 (TOURNIQUET CUFF) IMPLANT
CUFF TOURN SGL QUICK 18X4 (TOURNIQUET CUFF) IMPLANT
DRAPE FLUOR MINI C-ARM 54X84 (DRAPES) IMPLANT
DURAPREP 26ML APPLICATOR (WOUND CARE) ×1 IMPLANT
ELECT REM PT RETURN 9FT ADLT (ELECTROSURGICAL) ×1
ELECTRODE REM PT RTRN 9FT ADLT (ELECTROSURGICAL) ×1 IMPLANT
GAUZE SPONGE 4X4 12PLY STRL (GAUZE/BANDAGES/DRESSINGS) ×1 IMPLANT
GAUZE STRETCH 2X75IN STRL (MISCELLANEOUS) ×1 IMPLANT
GAUZE XEROFORM 1X8 LF (GAUZE/BANDAGES/DRESSINGS) ×1 IMPLANT
GLOVE BIO SURGEON STRL SZ7.5 (GLOVE) ×1 IMPLANT
GLOVE INDICATOR 8.0 STRL GRN (GLOVE) ×1 IMPLANT
GOWN STRL REUS W/ TWL LRG LVL3 (GOWN DISPOSABLE) ×2 IMPLANT
GOWN STRL REUS W/TWL LRG LVL3 (GOWN DISPOSABLE) ×2
HANDPIECE VERSAJET DEBRIDEMENT (MISCELLANEOUS) ×1 IMPLANT
KIT TURNOVER KIT A (KITS) ×1 IMPLANT
LABEL OR SOLS (LABEL) ×1 IMPLANT
MANIFOLD NEPTUNE II (INSTRUMENTS) ×1 IMPLANT
NEEDLE FILTER BLUNT 18X1 1/2 (NEEDLE) ×1 IMPLANT
NEEDLE HYPO 25X1 1.5 SAFETY (NEEDLE) ×3 IMPLANT
NS IRRIG 500ML POUR BTL (IV SOLUTION) ×1 IMPLANT
PACK EXTREMITY ARMC (MISCELLANEOUS) ×1 IMPLANT
PAD ABD DERMACEA PRESS 5X9 (GAUZE/BANDAGES/DRESSINGS) ×2 IMPLANT
RASP SM TEAR CROSS CUT (RASP) IMPLANT
SOL PREP PVP 2OZ (MISCELLANEOUS) ×1
SOLUTION PREP PVP 2OZ (MISCELLANEOUS) ×1 IMPLANT
STOCKINETTE STRL 6IN 960660 (GAUZE/BANDAGES/DRESSINGS) ×1 IMPLANT
SUT ETHILON 3-0 FS-10 30 BLK (SUTURE) ×1
SUT ETHILON 4-0 (SUTURE) ×1
SUT ETHILON 4-0 FS2 18XMFL BLK (SUTURE) ×1
SUT VIC AB 3-0 SH 27 (SUTURE) ×1
SUT VIC AB 3-0 SH 27X BRD (SUTURE) ×1 IMPLANT
SUT VIC AB 4-0 FS2 27 (SUTURE) ×1 IMPLANT
SUTURE EHLN 3-0 FS-10 30 BLK (SUTURE) ×1 IMPLANT
SUTURE ETHLN 4-0 FS2 18XMF BLK (SUTURE) ×1 IMPLANT
SYR 10ML LL (SYRINGE) ×2 IMPLANT
SYR 3ML LL SCALE MARK (SYRINGE) ×1 IMPLANT
TRAP FLUID SMOKE EVACUATOR (MISCELLANEOUS) ×1 IMPLANT
WATER STERILE IRR 500ML POUR (IV SOLUTION) ×1 IMPLANT

## 2022-12-22 NOTE — ED Notes (Signed)
Patient transported to MRI 

## 2022-12-22 NOTE — Progress Notes (Signed)
Pharmacy Antibiotic Note  Philip Benton is a 47 y.o. male admitted on 12/22/2022 with cellulitis.  Pharmacy has been consulted for Vancomycin dosing.  Plan: Vancomycin 1 gm IV X 1 given in ED followed by Vanc 1500 mg IV X 1 on 5/26 @ ~ 0200 to make total loading dose of 2500 mg.  Vancomycin 1750 mg IV Q24H ordered 5/27 @ 0200.  AUC = 528.4 Vanc trough = 11.0   Height: 5\' 11"  (180.3 cm) Weight: 108.9 kg (240 lb) IBW/kg (Calculated) : 75.3  Temp (24hrs), Avg:98.2 F (36.8 C), Min:98.2 F (36.8 C), Max:98.2 F (36.8 C)  Recent Labs  Lab 12/21/22 2358 12/22/22 0140  WBC 13.2*  --   CREATININE 1.40*  --   LATICACIDVEN 2.4* 1.9    Estimated Creatinine Clearance: 80.1 mL/min (A) (by C-G formula based on SCr of 1.4 mg/dL (H)).    Allergies  Allergen Reactions   Amoxicillin Nausea Only and Other (See Comments)    Has patient had a PCN reaction causing immediate rash, facial/tongue/throat swelling, SOB or lightheadedness with hypotension: No Has patient had a PCN reaction causing severe rash involving mucus membranes or skin necrosis: No Has patient had a PCN reaction that required hospitalization No Has patient had a PCN reaction occurring within the last 10 years: No If all of the above answers are "NO", then may proceed with Cephalosporin use.   Ibuprofen Other (See Comments)    Reaction:  GI bleeding    Ibuprofen Other (See Comments)    "bleeding in stool"    Penicillins Nausea And Vomiting    Antimicrobials this admission:   >>    >>   Dose adjustments this admission:   Microbiology results:  BCx:   UCx:    Sputum:    MRSA PCR:   Thank you for allowing pharmacy to be a part of this patient's care.  Tsuneo Faison D 12/22/2022 3:04 AM

## 2022-12-22 NOTE — Progress Notes (Signed)
Anticoagulation monitoring(Lovenox):  47 yo male ordered Lovenox 40 mg Q24h    Filed Weights   12/21/22 2359  Weight: 108.9 kg (240 lb)   BMI 33.5   Lab Results  Component Value Date   CREATININE 1.40 (H) 12/21/2022   CREATININE 0.87 09/24/2019   CREATININE 1.16 07/25/2018   Estimated Creatinine Clearance: 80.1 mL/min (A) (by C-G formula based on SCr of 1.4 mg/dL (H)). Hemoglobin & Hematocrit     Component Value Date/Time   HGB 12.6 (L) 12/21/2022 2358   HCT 39.6 12/21/2022 2358     Per Protocol for Patient with estCrcl > 30 ml/min and BMI > 30, will transition to Lovenox 55 mg Q24h.

## 2022-12-22 NOTE — ED Provider Notes (Signed)
[facility] Provider Note    Event Date/Time   First MD Initiated Contact with Patient 12/22/22 [phone]     (approximate)   History   Foot Swelling (RIGHT)   HPI  Philip Benton is a 47 y.o. male who presents to the ED for evaluation of Foot Swelling (RIGHT)   Patient self-reports a history of hep C and IVDU, using as recently as earlier today.  Denies ever shooting up between his toes or in his feet though.  No history of diabetes.  Presents to the ED for evaluation of 1 week of progressively worsening right foot pain and swelling.  Denies any precipitating trauma, injuries or falls.  No known puncture wounds to the plantar aspect of the feet.  No recent antibiotics.   Physical Exam   Triage Vital Signs: ED Triage Vitals  Enc Vitals Group     BP 12/21/22 2358 112/68     Pulse Rate 12/21/22 2358 93     Resp 12/21/22 2358 16     Temp 12/21/22 2358 98.2 F (36.8 C)     Temp src --      SpO2 12/21/22 2358 98 %     Weight 12/21/22 2359 240 lb (108.9 kg)     Height 12/21/22 2359 5\' 11"  (1.803 m)     Head Circumference --      Peak Flow --      Pain Score 12/21/22 2358 10     Pain Loc --      Pain Edu? --      Excl. in GC? --     Most recent vital signs: Vitals:   12/21/22 2358  BP: 112/68  Pulse: 93  Resp: 16  Temp: 98.2 F (36.8 C)  SpO2: 98%    General: Awake, no distress.  CV:  Good peripheral perfusion.  Resp:  Normal effort.  Abd:  No distention.  MSK:  No deformity noted.  Streaking erythema coming up a swollen foot on the right.  No signs of discrete trauma.  Questionable puncture wound on the plantar aspect of the base of the third metatarsal. Neuro:  No focal deficits appreciated. Other:   .      ED Results / Procedures / Treatments   Labs (all labs ordered are listed, but only abnormal results are displayed) Labs Reviewed  LACTIC ACID, PLASMA - Abnormal; Notable for the following components:      Result Value    Lactic Acid, Venous 2.4 (*)    All other components within normal limits  COMPREHENSIVE METABOLIC PANEL - Abnormal; Notable for the following components:   Sodium 133 (*)    Creatinine, Ser 1.40 (*)    Total Protein 8.3 (*)    All other components within normal limits  CBC WITH DIFFERENTIAL/PLATELET - Abnormal; Notable for the following components:   WBC 13.2 (*)    Hemoglobin 12.6 (*)    MCV 78.7 (*)    MCH 25.0 (*)    Neutro Abs 9.6 (*)    All other components within normal limits  CULTURE, BLOOD (ROUTINE X 2)  CULTURE, BLOOD (ROUTINE X 2)  LACTIC ACID, PLASMA  URINALYSIS, ROUTINE W REFLEX MICROSCOPIC  PROCALCITONIN    EKG   RADIOLOGY X-ray of the foot interpreted by me without clear signs of osteomyelitis, fracture or dislocation. Ultrasound without evidence of DVT on the right, interpreted by me.  Official radiology report(s): US Venous Img Lower Unilateral Right  Result Date: 12/22/2022 CLINICAL  DATA:  Right lower extremity pain and swelling x2 days. EXAM: RIGHT LOWER EXTREMITY VENOUS DOPPLER ULTRASOUND TECHNIQUE: Gray-scale sonography with compression, as well as color and duplex ultrasound, were performed to evaluate the deep venous system(s) from the level of the common femoral vein through the popliteal and proximal calf veins. COMPARISON:  None Available. FINDINGS: VENOUS Normal compressibility of the common femoral, superficial femoral, and popliteal veins, as well as the visualized calf veins. Visualized portions of profunda femoral vein and great saphenous vein unremarkable. No filling defects to suggest DVT on grayscale or color Doppler imaging. Doppler waveforms show normal direction of venous flow, normal respiratory plasticity and response to augmentation. Limited views of the contralateral common femoral vein are unremarkable. OTHER None. Limitations: none IMPRESSION: Negative. Electronically Signed   By: Aram Candela M.D.   On: 12/22/2022 01:32   DG Foot  Complete Right  Result Date: 12/22/2022 CLINICAL DATA:  swelling. puncture wound plantar, base of 3rd toe EXAM: RIGHT FOOT COMPLETE - 3+ VIEW COMPARISON:  None Available. FINDINGS: There is no evidence of fracture or dislocation. There is no evidence of arthropathy or other focal bone abnormality. Soft tissues are unremarkable. No retained radiopaque foreign body. IMPRESSION: Negative. Electronically Signed   By: Tish Frederickson M.D.   On: 12/22/2022 00:55    PROCEDURES and INTERVENTIONS:  .1-3 Lead EKG Interpretation  Performed by: Delton Prairie, MD Authorized by: Delton Prairie, MD     Interpretation: normal     ECG rate:  94   ECG rate assessment: normal     Conduction: normal   .Critical Care  Performed by: Delton Prairie, MD Authorized by: Delton Prairie, MD   Critical care provider statement:    Critical care time (minutes):  30   Critical care time was exclusive of:  Separately billable procedures and treating other patients   Critical care was necessary to treat or prevent imminent or life-threatening deterioration of the following conditions:  Sepsis   Critical care was time spent personally by me on the following activities:  Development of treatment plan with patient or surrogate, discussions with consultants, evaluation of patient's response to treatment, examination of patient, ordering and review of laboratory studies, ordering and review of radiographic studies, ordering and performing treatments and interventions, pulse oximetry, re-evaluation of patient's condition and review of old charts   Medications  cefTRIAXone (ROCEPHIN) 2 g in sodium chloride 0.9 % 100 mL IVPB (2 g Intravenous New Bag/Given 12/22/22 0138)  vancomycin (VANCOCIN) IVPB 1000 mg/200 mL premix (has no administration in time range)    Followed by  vancomycin (VANCOREADY) IVPB 1500 mg/300 mL (has no administration in time range)  0.9 %  sodium chloride infusion (has no administration in time range)   HYDROmorphone (DILAUDID) injection 1 mg (1 mg Intravenous Given 12/22/22 0109)  sodium chloride 0.9 % bolus 1,000 mL (1,000 mLs Intravenous New Bag/Given 12/22/22 0108)     IMPRESSION / MDM / ASSESSMENT AND PLAN / ED COURSE  I reviewed the triage vital signs and the nursing notes.  Differential diagnosis includes, but is not limited to, fracture, dislocation or other trauma, osteomyelitis, sepsis, cellulitis, abscess, residual needle from IVDU  {Patient presents with symptoms of an acute illness or injury that is potentially life-threatening.  Patient presents with atraumatic pain and swelling of the right foot with evidence of cellulitis and meeting sepsis criteria.  SIRS criteria with heart rate over 90 and leukocytosis.  Severe sepsis due to his mild lactic acidosis.  Metabolic panel  with slightly higher creatinine than remote baseline, uncertain if chronic or acute.  Leukocytosis to 13.  X-ray without bony changes.  Ultrasound without signs of DVT.  Started on antibiotics and I will consult with medicine.      FINAL CLINICAL IMPRESSION(S) / ED DIAGNOSES   Final diagnoses:  Cellulitis of foot     Rx / DC Orders   ED Discharge Orders     None        Note:  This document was prepared using Dragon voice recognition software and may include unintentional dictation errors.   Delton Prairie, MD 12/22/22 [phone]

## 2022-12-22 NOTE — Assessment & Plan Note (Addendum)
Infected puncture wound right foot Foot x-ray unremarkable Continue Rocephin and vancomycin Pain control Updated tetanus Podiatry consult
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
## 2022-12-22 NOTE — Progress Notes (Signed)
PHARMACY - PHYSICIAN COMMUNICATION CRITICAL VALUE ALERT - BLOOD CULTURE IDENTIFICATION (BCID)  Philip Benton is an 47 y.o. male who presented to [facility] on 12/22/2022 with a chief complaint of cellulitis of right foot.  Assessment:  1/4 bottles growing GPC (anaerobic bottle only).  BCID positive for staph species with no resistance genes detected.  Name of physician (or Provider) Contacted: Dr. Dareen Piano  Current antibiotics: Vancomycin and ceftriaxone  Changes to prescribed antibiotics recommended:  Patient is on recommended antibiotics - No changes needed  Results for orders placed or performed during the hospital encounter of 12/22/22  Blood Culture ID Panel (Reflexed) (Collected: 12/22/2022  1:47 AM)  Result Value Ref Range   Enterococcus faecalis NOT DETECTED NOT DETECTED   Enterococcus Faecium NOT DETECTED NOT DETECTED   Listeria monocytogenes NOT DETECTED NOT DETECTED   Staphylococcus species DETECTED (A) NOT DETECTED   Staphylococcus aureus (BCID) NOT DETECTED NOT DETECTED   Staphylococcus epidermidis NOT DETECTED NOT DETECTED   Staphylococcus lugdunensis NOT DETECTED NOT DETECTED   Streptococcus species NOT DETECTED NOT DETECTED   Streptococcus agalactiae NOT DETECTED NOT DETECTED   Streptococcus pneumoniae NOT DETECTED NOT DETECTED   Streptococcus pyogenes NOT DETECTED NOT DETECTED   A.calcoaceticus-baumannii NOT DETECTED NOT DETECTED   Bacteroides fragilis NOT DETECTED NOT DETECTED   Enterobacterales NOT DETECTED NOT DETECTED   Enterobacter cloacae complex NOT DETECTED NOT DETECTED   Escherichia coli NOT DETECTED NOT DETECTED   Klebsiella aerogenes NOT DETECTED NOT DETECTED   Klebsiella oxytoca NOT DETECTED NOT DETECTED   Klebsiella pneumoniae NOT DETECTED NOT DETECTED   Proteus species NOT DETECTED NOT DETECTED   Salmonella species NOT DETECTED NOT DETECTED   Serratia marcescens NOT DETECTED NOT DETECTED   Haemophilus influenzae NOT DETECTED NOT DETECTED    Neisseria meningitidis NOT DETECTED NOT DETECTED   Pseudomonas aeruginosa NOT DETECTED NOT DETECTED   Stenotrophomonas maltophilia NOT DETECTED NOT DETECTED   Candida albicans NOT DETECTED NOT DETECTED   Candida auris NOT DETECTED NOT DETECTED   Candida glabrata NOT DETECTED NOT DETECTED   Candida krusei NOT DETECTED NOT DETECTED   Candida parapsilosis NOT DETECTED NOT DETECTED   Candida tropicalis NOT DETECTED NOT DETECTED   Cryptococcus neoformans/gattii NOT DETECTED NOT DETECTED    Barrie Folk 12/22/2022  5:19 PM

## 2022-12-22 NOTE — Progress Notes (Signed)
  INTERVAL PROGRESS NOTE    Philip Benton- 47 y.o. male  LOS: 0 __________________________________________________________________  SUBJECTIVE: Admitted 12/22/2022 with cc of foot wound Chief Complaint  Patient presents with   Foot Swelling    RIGHT   Since admission, remains stable  OBJECTIVE: Blood pressure (!) 173/94, pulse 83, temperature 98.1 F (36.7 C), temperature source Oral, resp. rate 18, height 5\' 11"  (1.803 m), weight 108.9 kg, SpO2 98 %.  ASSESSMENT/PLAN:  I have reviewed the full H&P by Dr. Para March, and I agree with the assessment and plan as outlined therein. In addition: Has been evaluated by podiatry. Awaiting MRI of foot.  - planning I&D in OR tomorrow - continue analgesia - NPO at midnight   Principal Problem:   Cellulitis of right foot Active Problems:   Chronic pain syndrome   Long term (current) use of opiate analgesic   Infected puncture wound of plantar aspect of foot   Generalized seizure disorder (HCC)   Hepatitis C   Schizophrenia (HCC)   Polysubstance abuse (HCC)   Leeroy Bock, DO [facility] 12/22/2022, 12:37 PM    [email or web address] Available by Epic secure chat 7AM-7PM. If 7PM-7AM, please contact night-coverage   No Charge

## 2022-12-22 NOTE — Assessment & Plan Note (Signed)
Continue Depakote. 

## 2022-12-22 NOTE — Progress Notes (Signed)
CODE SEPSIS - PHARMACY COMMUNICATION  **Broad Spectrum Antibiotics should be administered within 1 hour of Sepsis diagnosis**  Time Code Sepsis Called/Page Received: 5/26 @ 0152  Antibiotics Ordered: Ceftriaxone, Vancomycin   Time of 1st antibiotic administration: Ceftriaxone 2 gm IV X 1   Additional action taken by pharmacy:   If necessary, Name of Provider/Nurse Contacted:     Ahmeer Tuman D ,PharmD Clinical Pharmacist  12/22/2022  2:02 AM

## 2022-12-22 NOTE — Assessment & Plan Note (Signed)
-   Continue buprenorphine

## 2022-12-22 NOTE — ED Notes (Signed)
Advised nurse that patient has ready bed 

## 2022-12-22 NOTE — Progress Notes (Addendum)
Pt states that he refuses blood transfusion/blood products. No reason given. Pt also states that he lives at home alone. Family at bedside visiting including wife and daughter.

## 2022-12-22 NOTE — Progress Notes (Signed)
Pt being followed by [facility] for Sepsis protocol. 

## 2022-12-22 NOTE — Assessment & Plan Note (Signed)
Counseled on abstinence 
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
## 2022-12-23 ENCOUNTER — Inpatient Hospital Stay: Payer: 59 | Admitting: Anesthesiology

## 2022-12-23 ENCOUNTER — Encounter
Admission: EM | Discharge status: Against Medical Advice | Payer: Self-pay | Source: Home / Self Care | Attending: Student in an Organized Health Care Education/Training Program

## 2022-12-23 DIAGNOSIS — L03115 Cellulitis of right lower limb: Secondary | ICD-10-CM

## 2022-12-23 HISTORY — PX: IRRIGATION AND DEBRIDEMENT ABSCESS: SHX5252

## 2022-12-23 LAB — CBC
HCT: 36.8 % — ABNORMAL LOW (ref 39.0–52.0)
Hemoglobin: 11.9 g/dL — ABNORMAL LOW (ref 13.0–17.0)
MCH: 25.2 pg — ABNORMAL LOW (ref 26.0–34.0)
MCHC: 32.3 g/dL (ref 30.0–36.0)
MCV: 78 fL — ABNORMAL LOW (ref 80.0–100.0)
Platelets: 248 10*3/uL (ref 150–400)
RBC: 4.72 MIL/uL (ref 4.22–5.81)
RDW: 15.2 % (ref 11.5–15.5)
WBC: 6.9 10*3/uL (ref 4.0–10.5)
nRBC: 0 % (ref 0.0–0.2)

## 2022-12-23 LAB — BASIC METABOLIC PANEL
Anion gap: 8 (ref 5–15)
BUN: 7 mg/dL (ref 6–20)
CO2: 24 mmol/L (ref 22–32)
Calcium: 8.6 mg/dL — ABNORMAL LOW (ref 8.9–10.3)
Chloride: 104 mmol/L (ref 98–111)
Creatinine, Ser: 0.84 mg/dL (ref 0.61–1.24)
GFR, Estimated: >60 mL/min (ref >60)
Glucose, Bld: 193 mg/dL — ABNORMAL HIGH (ref 70–99)
Potassium: 4 mmol/L (ref 3.5–5.1)
Sodium: 136 mmol/L (ref 135–145)

## 2022-12-23 SURGERY — IRRIGATION AND DEBRIDEMENT ABSCESS
Anesthesia: General | Laterality: Right

## 2022-12-23 MED ORDER — LIDOCAINE HCL (CARDIAC) PF 100 MG/5ML IV SOSY
PREFILLED_SYRINGE | INTRAVENOUS | Status: DC | PRN
  Administered 2022-12-23: 100 mg via INTRAVENOUS

## 2022-12-23 MED ORDER — FENTANYL CITRATE (PF) 100 MCG/2ML IJ SOLN
INTRAMUSCULAR | Status: AC
  Filled 2022-12-23: qty 2

## 2022-12-23 MED ORDER — MIDAZOLAM HCL 2 MG/2ML IJ SOLN
INTRAMUSCULAR | Status: DC | PRN
  Administered 2022-12-23: 2 mg via INTRAVENOUS

## 2022-12-23 MED ORDER — ALPRAZOLAM 1 MG PO TABS
1.0000 mg | ORAL_TABLET | Freq: Three times a day (TID) | ORAL | Status: DC
  Administered 2022-12-23: 1 mg via ORAL
  Filled 2022-12-23: qty 1

## 2022-12-23 MED ORDER — OXYCODONE HCL 5 MG PO TABS
ORAL_TABLET | ORAL | Status: AC
  Filled 2022-12-23: qty 1

## 2022-12-23 MED ORDER — DEXMEDETOMIDINE HCL IN NACL 80 MCG/20ML IV SOLN
INTRAVENOUS | Status: AC
  Filled 2022-12-23: qty 20

## 2022-12-23 MED ORDER — NEOMYCIN-POLYMYXIN B GU 40-200000 IR SOLN
Status: AC
  Filled 2022-12-23: qty 20

## 2022-12-23 MED ORDER — HYDROMORPHONE HCL 1 MG/ML IJ SOLN
INTRAMUSCULAR | Status: DC | PRN
  Administered 2022-12-23: 1 mg via INTRAVENOUS

## 2022-12-23 MED ORDER — ADULT MULTIVITAMIN W/MINERALS CH: 1.0000 | ORAL_TABLET | Freq: Every day | ORAL | Status: DC

## 2022-12-23 MED ORDER — KETAMINE HCL 10 MG/ML IJ SOLN
INTRAMUSCULAR | Status: DC | PRN
  Administered 2022-12-23: 50 mg via INTRAVENOUS

## 2022-12-23 MED ORDER — HYDROMORPHONE HCL 1 MG/ML IJ SOLN
INTRAMUSCULAR | Status: AC
  Filled 2022-12-23: qty 1

## 2022-12-23 MED ORDER — ALPRAZOLAM 1 MG PO TABS
1.0000 mg | ORAL_TABLET | Freq: Three times a day (TID) | ORAL | Status: DC
  Filled 2022-12-23: qty 1

## 2022-12-23 MED ORDER — JUVEN PO PACK: 1.0000 | PACK | Freq: Two times a day (BID) | ORAL | Status: DC

## 2022-12-23 MED ORDER — ZINC SULFATE 220 (50 ZN) MG PO CAPS: 220.0000 mg | ORAL_CAPSULE | Freq: Every day | ORAL | Status: DC

## 2022-12-23 MED ORDER — PROPOFOL 10 MG/ML IV BOLUS
INTRAVENOUS | Status: AC
  Filled 2022-12-23: qty 40

## 2022-12-23 MED ORDER — DULOXETINE HCL 30 MG PO CPEP: 30.0000 mg | ORAL_CAPSULE | Freq: Every day | ORAL | Status: DC

## 2022-12-23 MED ORDER — 0.9 % SODIUM CHLORIDE (POUR BTL) OPTIME
TOPICAL | Status: DC | PRN
  Administered 2022-12-23: 1000 mL

## 2022-12-23 MED ORDER — OXYCODONE HCL 5 MG/5ML PO SOLN: 5.0000 mg | Freq: Once | ORAL | Status: AC | PRN

## 2022-12-23 MED ORDER — BUPIVACAINE HCL (PF) 0.5 % IJ SOLN
INTRAMUSCULAR | Status: AC
  Filled 2022-12-23: qty 30

## 2022-12-23 MED ORDER — VANCOMYCIN HCL 1000 MG IV SOLR
INTRAVENOUS | Status: AC
  Filled 2022-12-23: qty 20

## 2022-12-23 MED ORDER — DEXAMETHASONE SODIUM PHOSPHATE 10 MG/ML IJ SOLN
INTRAMUSCULAR | Status: DC | PRN
  Administered 2022-12-23: 10 mg via INTRAVENOUS

## 2022-12-23 MED ORDER — MIDAZOLAM HCL 2 MG/2ML IJ SOLN
INTRAMUSCULAR | Status: AC
  Filled 2022-12-23: qty 2

## 2022-12-23 MED ORDER — FENTANYL CITRATE (PF) 100 MCG/2ML IJ SOLN: 25.0000 ug | INTRAMUSCULAR | Status: DC | PRN

## 2022-12-23 MED ORDER — VANCOMYCIN HCL 1250 MG/250ML IV SOLN
1250.0000 mg | Freq: Two times a day (BID) | INTRAVENOUS | Status: DC
  Administered 2022-12-23: 1250 mg via INTRAVENOUS
  Filled 2022-12-23 (×2): qty 250

## 2022-12-23 MED ORDER — LIDOCAINE HCL (PF) 2 % IJ SOLN
INTRAMUSCULAR | Status: AC
  Filled 2022-12-23: qty 5

## 2022-12-23 MED ORDER — PROPOFOL 10 MG/ML IV BOLUS
INTRAVENOUS | Status: DC | PRN
  Administered 2022-12-23: 200 mg via INTRAVENOUS

## 2022-12-23 MED ORDER — SODIUM CHLORIDE 0.9 % IR SOLN
Status: DC | PRN
  Administered 2022-12-23: 500 mL

## 2022-12-23 MED ORDER — DEXMEDETOMIDINE HCL IN NACL 80 MCG/20ML IV SOLN
INTRAVENOUS | Status: DC | PRN
  Administered 2022-12-23 (×2): 8 ug via INTRAVENOUS
  Administered 2022-12-23: 12 ug via INTRAVENOUS
  Administered 2022-12-23 (×4): 8 ug via INTRAVENOUS

## 2022-12-23 MED ORDER — ONDANSETRON HCL 4 MG/2ML IJ SOLN
INTRAMUSCULAR | Status: DC | PRN
  Administered 2022-12-23: 4 mg via INTRAVENOUS

## 2022-12-23 MED ORDER — KETAMINE HCL 50 MG/5ML IJ SOSY
PREFILLED_SYRINGE | INTRAMUSCULAR | Status: AC
  Filled 2022-12-23: qty 5

## 2022-12-23 MED ORDER — LISINOPRIL 10 MG PO TABS
10.0000 mg | ORAL_TABLET | Freq: Every day | ORAL | Status: DC
  Administered 2022-12-23: 10 mg via ORAL
  Filled 2022-12-23: qty 1

## 2022-12-23 MED ORDER — BUPIVACAINE HCL (PF) 0.5 % IJ SOLN
INTRAMUSCULAR | Status: DC | PRN
  Administered 2022-12-23: 10 mL

## 2022-12-23 MED ORDER — FENTANYL CITRATE (PF) 100 MCG/2ML IJ SOLN
INTRAMUSCULAR | Status: DC | PRN
  Administered 2022-12-23 (×2): 100 ug via INTRAVENOUS

## 2022-12-23 MED ORDER — OXYCODONE HCL 5 MG PO TABS
5.0000 mg | ORAL_TABLET | Freq: Once | ORAL | Status: AC | PRN
  Administered 2022-12-23: 5 mg via ORAL

## 2022-12-23 MED ORDER — PANTOPRAZOLE SODIUM 40 MG PO TBEC
40.0000 mg | DELAYED_RELEASE_TABLET | Freq: Every day | ORAL | Status: DC
  Administered 2022-12-23: 40 mg via ORAL
  Filled 2022-12-23: qty 1

## 2022-12-23 MED ORDER — VITAMIN C 500 MG PO TABS: 500.0000 mg | ORAL_TABLET | Freq: Two times a day (BID) | ORAL | Status: DC

## 2022-12-23 MED ORDER — LACTATED RINGERS IV SOLN: INTRAVENOUS | Status: DC | PRN

## 2022-12-23 SURGICAL SUPPLY — 37 items
BNDG ELASTIC 4X5.8 VLCR NS LF (GAUZE/BANDAGES/DRESSINGS) ×1 IMPLANT
BNDG ESMARCH 4 X 12 STRL LF (GAUZE/BANDAGES/DRESSINGS) ×1
BNDG ESMARCH 4X12 STRL LF (GAUZE/BANDAGES/DRESSINGS) ×1 IMPLANT
BNDG GAUZE DERMACEA FLUFF 4 (GAUZE/BANDAGES/DRESSINGS) ×1 IMPLANT
DURAPREP 26ML APPLICATOR (WOUND CARE) ×1 IMPLANT
ELECT REM PT RETURN 9FT ADLT (ELECTROSURGICAL) ×1
ELECTRODE REM PT RTRN 9FT ADLT (ELECTROSURGICAL) ×1 IMPLANT
GAUZE SPONGE 4X4 12PLY STRL (GAUZE/BANDAGES/DRESSINGS) ×1 IMPLANT
GAUZE STRETCH 2X75IN STRL (MISCELLANEOUS) ×1 IMPLANT
GAUZE XEROFORM 1X8 LF (GAUZE/BANDAGES/DRESSINGS) ×1 IMPLANT
GLOVE BIO SURGEON STRL SZ7.5 (GLOVE) ×1 IMPLANT
GLOVE INDICATOR 8.0 STRL GRN (GLOVE) ×1 IMPLANT
GOWN STRL REUS W/ TWL LRG LVL3 (GOWN DISPOSABLE) ×2 IMPLANT
GOWN STRL REUS W/TWL LRG LVL3 (GOWN DISPOSABLE) ×2
HANDPIECE VERSAJET DEBRIDEMENT (MISCELLANEOUS) ×1 IMPLANT
KIT TURNOVER KIT A (KITS) ×1 IMPLANT
LABEL OR SOLS (LABEL) ×1 IMPLANT
MANIFOLD NEPTUNE II (INSTRUMENTS) ×1 IMPLANT
NDL FILTER BLUNT 18X1 1/2 (NEEDLE) ×1 IMPLANT
NDL HYPO 25X1 1.5 SAFETY (NEEDLE) ×3 IMPLANT
NEEDLE FILTER BLUNT 18X1 1/2 (NEEDLE) ×1 IMPLANT
NEEDLE HYPO 25X1 1.5 SAFETY (NEEDLE) ×3 IMPLANT
NS IRRIG 500ML POUR BTL (IV SOLUTION) ×1 IMPLANT
PACK EXTREMITY ARMC (MISCELLANEOUS) ×1 IMPLANT
PAD ABD DERMACEA PRESS 5X9 (GAUZE/BANDAGES/DRESSINGS) ×2 IMPLANT
SOL PREP PVP 2OZ (MISCELLANEOUS) ×1
SOLUTION PREP PVP 2OZ (MISCELLANEOUS) ×1 IMPLANT
STOCKINETTE STRL 6IN 960660 (GAUZE/BANDAGES/DRESSINGS) ×1 IMPLANT
SUT ETHILON 3-0 FS-10 30 BLK (SUTURE) ×1
SUT ETHILON 4-0 (SUTURE) ×1
SUT ETHILON 4-0 FS2 18XMFL BLK (SUTURE) ×1
SUTURE EHLN 3-0 FS-10 30 BLK (SUTURE) ×1 IMPLANT
SUTURE ETHLN 4-0 FS2 18XMF BLK (SUTURE) ×1 IMPLANT
SYR 10ML LL (SYRINGE) ×2 IMPLANT
SYR 3ML LL SCALE MARK (SYRINGE) ×1 IMPLANT
TRAP FLUID SMOKE EVACUATOR (MISCELLANEOUS) ×1 IMPLANT
WATER STERILE IRR 500ML POUR (IV SOLUTION) ×1 IMPLANT

## 2022-12-23 NOTE — Progress Notes (Addendum)
Patient states that he is leaving AMA. Attending on call notified. Patient educated on need for IV antibiotics and infection prevention for wound. Patient verbalizes understanding and agreeability. AMA form signed by patient.  Patient removed both his PIV himself. No bleeding, catheters intact.

## 2022-12-23 NOTE — Interval H&P Note (Signed)
History and Physical Interval Note:  12/23/2022 9:13 AM  Philip Benton  has presented today for surgery, with the diagnosis of Osteo.  The various methods of treatment have been discussed with the patient and family. After consideration of risks, benefits and other options for treatment, the patient has consented to  Procedure(s): IRRIGATION AND DEBRIDEMENT ABSCESS (Right) as a surgical intervention.  The patient's history has been reviewed, patient examined, no change in status, stable for surgery.  I have reviewed the patient's chart and labs.  Questions were answered to the patient's satisfaction.     Ricci Barker

## 2022-12-23 NOTE — OR Nursing (Signed)
Dr. Alberteen Spindle put a saline wet to dry type drain with a 4x4 on patient's right foot

## 2022-12-23 NOTE — Progress Notes (Signed)
Initial Nutrition Assessment  DOCUMENTATION CODES:   Obesity unspecified  INTERVENTION:   -Once diet is advanced, add:   -MVI with minerals daily -500 mg vitamin C BID -220 mg zinc sulfate daily x 14 days -1 packet Juven BID, each packet provides 95 calories, 2.5 grams of protein (collagen), and 9.8 grams of carbohydrate (3 grams sugar); also contains 7 grams of L-arginine and L-glutamine, 300 mg vitamin C, 15 mg vitamin E, 1.2 mcg vitamin B-12, 9.5 mg zinc, 200 mg calcium, and 1.5 g  Calcium Beta-hydroxy-Beta-methylbutyrate to support wound healing  -Double protein portions with meals  NUTRITION DIAGNOSIS:   Increased nutrient needs related to post-op healing as evidenced by estimated needs.  GOAL:   Patient will meet greater than or equal to 90% of their needs  MONITOR:   PO intake, Supplement acceptance, Diet advancement  REASON FOR ASSESSMENT:   Malnutrition Screening Tool    ASSESSMENT:   Pt with medical history significant for HTN, chronic pain, schizoaffective disorder, seizure disorder, polysubstance abuse including IV drug use, who presents with a 1 week history of pain and swelling of the right foot PTA that started in the third toe and then extended to the dorsum of the foot and is now streaking up his leg.   He received a puncture wound to the plantar aspect of his foot about a week prior PTA.  Pt admitted with rt foot cellulitis and infected puncture wound to rt foot.   Reviewed I/O's: -660 ml x 24 hours and -64 ml since admission  UOP: 1.3 L x 24 hours  Pt unavailable at time of visit. Pt down in OR at time of procedure. RD unable to obtain further nutrition-related history or complete nutrition-focused physical exam at this time.     Plan for I&D of rt foot today with podiatry.   Pt previously on a heart healthy diet. Noted meal completions 75%.   Reviewed wt hx; no wt loss noted over the past 2 years.   Pt with increased nutritional needs due to  post-operative healing and would benefit from addition of oral nutrition supplements.   Medications reviewed.   Labs reviewed: Na: 133.    Diet Order:   Diet Order             Diet NPO time specified  Diet effective midnight                   EDUCATION NEEDS:   No education needs have been identified at this time  Skin:  Skin Assessment: Skin Integrity Issues: Skin Integrity Issues:: Incisions Incisions: clsoed rt foot  Last BM:  12/21/22  Height:   Ht Readings from Last 1 Encounters:  12/21/22 5\' 11"  (1.803 m)    Weight:   Wt Readings from Last 1 Encounters:  12/21/22 108.9 kg    Ideal Body Weight:  78.2 kg  BMI:  Body mass index is 33.47 kg/m.  Estimated Nutritional Needs:   Kcal:  2150-2350  Protein:  105-120 grams  Fluid:  > 2 L    Levada Schilling, RD, LDN, CDCES Registered Dietitian II Certified Diabetes Care and Education Specialist Please refer to [facility] for RD and/or RD on-call/weekend/after hours pager

## 2022-12-23 NOTE — Plan of Care (Signed)

## 2022-12-23 NOTE — Op Note (Signed)
Date of operation: 12/23/2022.  Surgeon: Ricci Barker D.P.M.  Preoperative diagnosis: Abscess right foot.  Postoperative diagnosis: Same.  Procedure: Incision and drainage abscess right foot.  Anesthesia: LMA.  Hemostasis: Esmarch tourniquet right ankle.  Estimated blood loss: 15 cc.  Injectables: 10 cc 0.5% Marcaine plain.  Cultures: Deep wound abscess right foot.  Drain: Saline gauze wet-to-dry.  Complications: None apparent.  Operative indications: This is a 47 year old male with 1 week history of worsening abscessed area on his right foot.  Denied stepping on any foreign bodies.  Presented to the emergency department and determination was made for I&D of the abscessed area on the right foot.  Operative procedure: Patient was taken to the operating room and placed on the table in the supine position.  Following satisfactory LMA anesthesia the right foot was prepped and draped in the usual sterile fashion.  The foot was exsanguinated using an Esmarch bandage which was then left around the ankle to act as a tourniquet.  Attention was directed to the plantar aspect of the right third toe and forefoot where an approximate 2 cm linear incision was made.  Immediately a large amount of purulent drainage and phlegmon material was noted.  Culture was taken for sensitivities.  The incision was deepened using hemostats and the wound was thoroughly debrided and irrigated using a Versajet debrider on a setting of 4.  A significant amount of bleeding was noted through the tourniquet so this was then released and the bleeding did slow down significantly.  The wound was flushed with copious amounts of sterile saline and closed using 4-0 nylon simple interrupted sutures leaving a small area open which was packed with the tip of a sterile saline gauze 4 x 4.  10 cc of 0.5% Marcaine plain were then injected around the third ray for postoperative analgesia.  4 x 4's and conformer applied to the left foot  followed by ABD Curlex and Ace wrap.  Patient was awakened and transported the PACU having tolerated the anesthesia and procedure well.

## 2022-12-23 NOTE — Progress Notes (Signed)
                                                  Against Medical Advice Patient at this time expresses desire to leave the Hospital immediately, patient has been warned that this is not Medically advisable at this time, and can result in Medical complications like Death and Disability. Specifically discussed *** Patient has full decision making capacity and understands and accepts the risks involved and assumes full responsibilty of this decision.  This patient has also been advised that if they feel the need for further medical assistance to return to the closest ER or dial [phone].  Informed by Nursing staff that this patient has left care and has signed the [facility] form.     This document was prepared using Dragon voice recognition software and may include unintentional dictation errors.  Bishop Limbo DNP, MBA, FNP-BC, PMHNP-BC Nurse Practitioner [facility] Pager [phone]

## 2022-12-23 NOTE — Progress Notes (Signed)
Pharmacy Antibiotic Note  Philip Benton is a 47 y.o. male admitted on 12/22/2022 with cellulitis.  Pharmacy has been consulted for Vancomycin dosing. Hx of MRSA in 2008. Bcx 2 out of 4 growing GPC.   Plan: Pt received vancomycin loading dose 2000 mg x 1 followed by 1750 mg daily. Scr improved. Will adjust vancomycin to 1250mg  BID. Predicted AUC of 466. Goal 400-600. Plan to obtain vancomycin level after 4th or 5th dose.   Height: 5\' 11"  (180.3 cm) Weight: 108.9 kg (240 lb) IBW/kg (Calculated) : 75.3  Temp (24hrs), Avg:98.1 F (36.7 C), Min:97.3 F (36.3 C), Max:98.9 F (37.2 C)  Recent Labs  Lab 12/21/22 2358 12/22/22 0140 12/23/22 1127  WBC 13.2*  --  6.9  CREATININE 1.40*  --  0.84  LATICACIDVEN 2.4* 1.9  --      Estimated Creatinine Clearance: 133.5 mL/min (by C-G formula based on SCr of 0.84 mg/dL).    Allergies  Allergen Reactions   Amoxicillin Nausea Only and Other (See Comments)    Has patient had a PCN reaction causing immediate rash, facial/tongue/throat swelling, SOB or lightheadedness with hypotension: No Has patient had a PCN reaction causing severe rash involving mucus membranes or skin necrosis: No Has patient had a PCN reaction that required hospitalization No Has patient had a PCN reaction occurring within the last 10 years: No If all of the above answers are "NO", then may proceed with Cephalosporin use.   Ibuprofen Other (See Comments)    Reaction:  GI bleeding    Ibuprofen Other (See Comments)    "bleeding in stool"    Penicillins Nausea And Vomiting    Antimicrobials this admission: 5/26 ceftriaxone  >>  5/26 vancomycin  >>    Microbiology results:  5/26 BCx: 2 out of 4 bottles STAPHYLOCOCCUS SIMULANS    Thank you for allowing pharmacy to be a part of this patient's care.  Ronnald Ramp, PharmD, BCPS 12/23/2022 12:28 PM
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
## 2022-12-23 NOTE — Anesthesia Postprocedure Evaluation (Signed)
Anesthesia Post Note  Patient: Philip Benton  Procedure(s) Performed: IRRIGATION AND DEBRIDEMENT RIGHT FOOT ABSCESS (Right)  Patient location during evaluation: PACU Anesthesia Type: General Level of consciousness: awake and alert Pain management: pain level controlled Vital Signs Assessment: post-procedure vital signs reviewed and stable Respiratory status: spontaneous breathing, nonlabored ventilation, respiratory function stable and patient connected to nasal cannula oxygen Cardiovascular status: blood pressure returned to baseline and stable Postop Assessment: no apparent nausea or vomiting Anesthetic complications: no   No notable events documented.   Last Vitals:  Vitals:   12/23/22 0938 12/23/22 0954  BP: (!) 147/97 (!) 156/103  Pulse: 70 71  Resp: 14 18  Temp: 36.8 C 36.6 C  SpO2: 100% 100%    Last Pain:  Vitals:   12/23/22 0938  TempSrc:   PainSc: 4                  Louie Boston

## 2022-12-23 NOTE — Progress Notes (Signed)
PROGRESS NOTE  Philip Benton    DOB: 11/24/1975, 47 y.o.  ZOX:[id number]    Code Status: Full Code   DOA: 12/22/2022   LOS: 1   Brief hospital course  Philip Benton is a 47 y.o. male with a PMH significant for HTN, chronic pain, schizoaffective disorder, seizure disorder, polysubstance abuse including IV drug use.  They presented from home to the ED on 12/22/2022 with pain and swelling of R foot x 7 days. In relation to puncture wound to foot.   In the ED, it was found that they had vitals WNL.  Significant findings included WBC 13,000 with lactic acid 1.9.  Other abnormalities included hemoglobin 12.6 creatinine 1.4. blood cultures collected and pending. Venous ultrasound was negative for DVT X-ray foot was nonacute. LE doppler: negative R foot MRI: Superficial 0.7 x 1.0 x 1.6 cm phlegmon/early abscess at the plantar base of the third PIP joint. No evidence of osteomyelitis.  They were initially treated with CTX, vancomycin and IVF. Podiatry was consulted.    Patient was admitted to medicine service for further workup and management of foot infection as outlined in detail below.  12/23/22 -I&D in OR. Hemodynamically stable. Remains on IV Abx  Assessment & Plan  Principal Problem:   Cellulitis of right foot Active Problems:   Chronic pain syndrome   Long term (current) use of opiate analgesic   Infected puncture wound of plantar aspect of foot   Generalized seizure disorder (HCC)   Hepatitis C   Schizophrenia (HCC)   Polysubstance abuse (HCC)  Cellulitis  abscess R foot- as seen on MRI. Blood culture positive for staph simulans. Wound culture pending.  - Podiatry following, appreciate care - I&D 5/27 - continue Rocephin and vancomycin - Pain control PRN Updated tetanus   H/o Opioid dependence - suboxone not available. Treating for withdrawal symptoms   Generalized seizure disorder  schizophrenia-  - continue home meds: Depakote, cymbalta  Body mass index is  33.47 kg/m.  VTE ppx: lovenox  Diet:     Diet   Diet NPO time specified   Consultants: podiatry  Subjective 12/23/22    Pt reports moderate post-op pain. Just received his Prn pain treatment. States he's tired of lying in the hospital and he will be leaving tomorrow.    Objective   Vitals:   12/22/22 1315 12/22/22 1400 12/22/22 1432 12/22/22 2309  BP: (!) 143/90 (!) 142/62 (!) 150/95 (!) 144/92  Pulse: 79 68 79 87  Resp: 18 18 18 20   Temp:   98.3 F (36.8 C) 98.9 F (37.2 C)  TempSrc:      SpO2: 96% 95% 99% 99%  Weight:      Height:        Intake/Output Summary (Last 24 hours) at 12/23/2022 0724 Last data filed at 12/23/2022 0357 Gross per 24 hour  Intake 590 ml  Output 1250 ml  Net -660 ml   Filed Weights   12/21/22 2359  Weight: 108.9 kg    Physical Exam:  General: awake, alert, NAD HEENT: atraumatic, clear conjunctiva, anicteric sclera, MMM, hearing grossly normal Respiratory: normal respiratory effort. Cardiovascular: quick capillary refill Nervous: A&O x3. no gross focal neurologic deficits, normal speech Extremities: moves all equally, no edema, normal tone Skin: R foot wrapped in clean surgical dressing Psychiatry: agitated, restless  Labs   I have personally reviewed the following labs and imaging studies CBC    Component Value Date/Time   WBC 13.2 (H) 12/21/2022 2358  RBC 5.03 12/21/2022 2358   HGB 12.6 (L) 12/21/2022 2358   HCT 39.6 12/21/2022 2358   PLT 294 12/21/2022 2358   MCV 78.7 (L) 12/21/2022 2358   MCH 25.0 (L) 12/21/2022 2358   MCHC 31.8 12/21/2022 2358   RDW 15.1 12/21/2022 2358   LYMPHSABS 2.4 12/21/2022 2358   MONOABS 0.9 12/21/2022 2358   EOSABS 0.2 12/21/2022 2358   BASOSABS 0.1 12/21/2022 2358      Latest Ref Rng & Units 12/21/2022   11:58 PM 09/24/2019    3:44 PM 07/25/2018    9:21 PM  BMP  Glucose 70 - 99 mg/dL 95  94  161   BUN 6 - 20 mg/dL 13  23  14    Creatinine 0.61 - 1.24 mg/dL 0.96  0.45  4.09   Sodium 135  - 145 mmol/L 133  137  138   Potassium 3.5 - 5.1 mmol/L 3.5  3.8  3.6   Chloride 98 - 111 mmol/L 99  100  106   CO2 22 - 32 mmol/L 23  28  21    Calcium 8.9 - 10.3 mg/dL 9.4  9.2  8.6     MR FOOT RIGHT WO CONTRAST  Result Date: 12/22/2022 CLINICAL DATA:  Right foot infection.  Pain and swelling. EXAM: MRI OF THE RIGHT FOREFOOT WITHOUT CONTRAST TECHNIQUE: Multiplanar, multisequence MR imaging of the right forefoot was performed. No intravenous contrast was administered. COMPARISON:  Right foot x-rays from same day. FINDINGS: Bones/Joint/Cartilage No marrow signal abnormality. No fracture or dislocation. Joint spaces are preserved. No joint effusion. Ligaments Collateral ligaments are intact. Muscles and Tendons Flexor and extensor tendons are intact. No tenosynovitis. Soft tissue Soft tissue swelling of the second and third toes extending into the dorsal foot. Superficial 0.7 x 1.0 x 1.6 cm heterogeneously T2 hyperintense collection at the plantar base of the third PIP joint with overlying small skin ulceration. IMPRESSION: 1. Superficial 0.7 x 1.0 x 1.6 cm phlegmon/early abscess at the plantar base of the third PIP joint. 2. No evidence of osteomyelitis. Electronically Signed   By: Obie Dredge M.D.   On: 12/22/2022 12:35   US Venous Img Lower Unilateral Right  Result Date: 12/22/2022 CLINICAL DATA:  Right lower extremity pain and swelling x2 days. EXAM: RIGHT LOWER EXTREMITY VENOUS DOPPLER ULTRASOUND TECHNIQUE: Gray-scale sonography with compression, as well as color and duplex ultrasound, were performed to evaluate the deep venous system(s) from the level of the common femoral vein through the popliteal and proximal calf veins. COMPARISON:  None Available. FINDINGS: VENOUS Normal compressibility of the common femoral, superficial femoral, and popliteal veins, as well as the visualized calf veins. Visualized portions of profunda femoral vein and great saphenous vein unremarkable. No filling defects  to suggest DVT on grayscale or color Doppler imaging. Doppler waveforms show normal direction of venous flow, normal respiratory plasticity and response to augmentation. Limited views of the contralateral common femoral vein are unremarkable. OTHER None. Limitations: none IMPRESSION: Negative. Electronically Signed   By: Aram Candela M.D.   On: 12/22/2022 01:32   DG Foot Complete Right  Result Date: 12/22/2022 CLINICAL DATA:  swelling. puncture wound plantar, base of 3rd toe EXAM: RIGHT FOOT COMPLETE - 3+ VIEW COMPARISON:  None Available. FINDINGS: There is no evidence of fracture or dislocation. There is no evidence of arthropathy or other focal bone abnormality. Soft tissues are unremarkable. No retained radiopaque foreign body. IMPRESSION: Negative. Electronically Signed   By: Tish Frederickson M.D.   On: 12/22/2022  00:55    Disposition Plan & Communication  Patient status: Inpatient  Admitted From: Home Planned disposition location: Home Anticipated discharge date: 5/28 pending surgical wound culture results  Family Communication: none at bedside    Author: Leeroy Bock, DO [facility] 12/23/2022, 7:24 AM   Available by Epic secure chat 7AM-7PM. If 7PM-7AM, please contact night-coverage.  [facility] contact information found on [email or web address].

## 2022-12-23 NOTE — Anesthesia Procedure Notes (Signed)
Procedure Name: LMA Insertion Date/Time: 12/23/2022 8:15 AM  Performed by: Katherine Basset, CRNAPre-anesthesia Checklist: Patient identified, Emergency Drugs available, Suction available and Patient being monitored Patient Re-evaluated:Patient Re-evaluated prior to induction Oxygen Delivery Method: Circle system utilized Preoxygenation: Pre-oxygenation with 100% oxygen Induction Type: IV induction LMA: LMA inserted LMA Size: 5.0 Number of attempts: 1 Placement Confirmation: positive ETCO2 and breath sounds checked- equal and bilateral Tube secured with: Tape Dental Injury: Teeth and Oropharynx as per pre-operative assessment  Comments: Igel size 5 utilized w/o difficulty

## 2022-12-23 NOTE — Transfer of Care (Signed)
Immediate Anesthesia Transfer of Care Note  Patient: Philip Benton  Procedure(s) Performed: IRRIGATION AND DEBRIDEMENT ABSCESS (Right)  Patient Location: PACU  Anesthesia Type:General  Level of Consciousness: drowsy  Airway & Oxygen Therapy: Patient Spontanous Breathing and Patient connected to face mask oxygen  Post-op Assessment: Report given to RN, Post -op Vital signs reviewed and stable, and Patient moving all extremities  Post vital signs: Reviewed and stable  Last Vitals:  Vitals Value Taken Time  BP 145/98 12/23/22 0900  Temp    Pulse 68 12/23/22 0903  Resp 19 12/23/22 0903  SpO2 100 % 12/23/22 0903  Vitals shown include unvalidated device data.  Last Pain:  Vitals:   12/23/22 0438  TempSrc:   PainSc: Asleep      Patients Stated Pain Goal: 2 (12/23/22 0408)  Complications: No notable events documented.

## 2022-12-24 ENCOUNTER — Encounter: Payer: Self-pay | Admitting: Podiatry

## 2022-12-24 LAB — CULTURE, BLOOD (ROUTINE X 2): Special Requests: ADEQUATE

## 2022-12-24 LAB — HIV ANTIBODY (ROUTINE TESTING W REFLEX): HIV Screen 4th Generation wRfx: NONREACTIVE

## 2022-12-24 LAB — AEROBIC/ANAEROBIC CULTURE W GRAM STAIN (SURGICAL/DEEP WOUND)

## 2022-12-25 LAB — AEROBIC/ANAEROBIC CULTURE W GRAM STAIN (SURGICAL/DEEP WOUND)

## 2022-12-27 LAB — CULTURE, BLOOD (ROUTINE X 2)
Culture: NO GROWTH
Special Requests: ADEQUATE

## 2022-12-28 LAB — AEROBIC/ANAEROBIC CULTURE W GRAM STAIN (SURGICAL/DEEP WOUND): Gram Stain: NONE SEEN

## 2023-02-14 ENCOUNTER — Ambulatory Visit (HOSPITAL_COMMUNITY)
Admission: RE | Admit: 2023-02-14 | Discharge: 2023-02-14 | Disposition: A | Discharge status: Home / Self Care | Payer: 59 | Source: Ambulatory Visit | Attending: Family Medicine | Admitting: Family Medicine

## 2023-02-14 ENCOUNTER — Encounter (HOSPITAL_COMMUNITY): Payer: Self-pay | Admitting: Family Medicine

## 2023-02-14 ENCOUNTER — Other Ambulatory Visit (HOSPITAL_COMMUNITY): Payer: Self-pay | Admitting: Family Medicine

## 2023-02-14 DIAGNOSIS — S91301A Unspecified open wound, right foot, initial encounter: Secondary | ICD-10-CM

## 2023-03-13 DIAGNOSIS — Z5181 Encounter for therapeutic drug level monitoring: Secondary | ICD-10-CM | POA: Diagnosis not present

## 2023-08-26 DIAGNOSIS — Z5181 Encounter for therapeutic drug level monitoring: Secondary | ICD-10-CM | POA: Diagnosis not present

## 2023-09-30 DIAGNOSIS — Z961 Presence of intraocular lens: Secondary | ICD-10-CM | POA: Diagnosis not present

## 2023-09-30 DIAGNOSIS — H524 Presbyopia: Secondary | ICD-10-CM | POA: Diagnosis not present

## 2024-02-24 DIAGNOSIS — Z5181 Encounter for therapeutic drug level monitoring: Secondary | ICD-10-CM | POA: Diagnosis not present
